# Patient Record
Sex: Female | Born: 1970 | Race: White | Hispanic: No | Marital: Married | Smoking: Former smoker
Health system: Southern US, Community
[De-identification: ages and names within clinical notes are randomized; demographics above are authoritative.]

## PROBLEM LIST (undated history)

## (undated) DIAGNOSIS — S42009A Fracture of unspecified part of unspecified clavicle, initial encounter for closed fracture: Secondary | ICD-10-CM

## (undated) DIAGNOSIS — S4290XA Fracture of unspecified shoulder girdle, part unspecified, initial encounter for closed fracture: Secondary | ICD-10-CM

## (undated) DIAGNOSIS — B379 Candidiasis, unspecified: Secondary | ICD-10-CM

## (undated) DIAGNOSIS — K219 Gastro-esophageal reflux disease without esophagitis: Secondary | ICD-10-CM

## (undated) DIAGNOSIS — S62109A Fracture of unspecified carpal bone, unspecified wrist, initial encounter for closed fracture: Secondary | ICD-10-CM

## (undated) DIAGNOSIS — E785 Hyperlipidemia, unspecified: Secondary | ICD-10-CM

## (undated) DIAGNOSIS — Z8619 Personal history of other infectious and parasitic diseases: Secondary | ICD-10-CM

## (undated) HISTORY — DX: Hyperlipidemia, unspecified: E78.5

## (undated) HISTORY — DX: Personal history of other infectious and parasitic diseases: Z86.19

## (undated) HISTORY — DX: Fracture of unspecified carpal bone, unspecified wrist, initial encounter for closed fracture: S62.109A

## (undated) HISTORY — DX: Fracture of unspecified shoulder girdle, part unspecified, initial encounter for closed fracture: S42.90XA

## (undated) HISTORY — PX: THERAPEUTIC ABORTION: SHX798

## (undated) HISTORY — DX: Candidiasis, unspecified: B37.9

## (undated) HISTORY — DX: Fracture of unspecified part of unspecified clavicle, initial encounter for closed fracture: S42.009A

## (undated) HISTORY — DX: Gastro-esophageal reflux disease without esophagitis: K21.9

---

## 1999-09-28 ENCOUNTER — Other Ambulatory Visit: Admission: RE | Admit: 1999-09-28 | Discharge: 1999-09-28 | Payer: Self-pay | Admitting: Obstetrics and Gynecology

## 1999-10-15 ENCOUNTER — Inpatient Hospital Stay (HOSPITAL_COMMUNITY): Admission: AD | Admit: 1999-10-15 | Discharge: 1999-10-15 | Payer: Self-pay | Admitting: Obstetrics and Gynecology

## 2000-03-29 ENCOUNTER — Inpatient Hospital Stay (HOSPITAL_COMMUNITY): Admission: AD | Admit: 2000-03-29 | Discharge: 2000-03-29 | Payer: Self-pay | Admitting: Obstetrics and Gynecology

## 2000-04-26 ENCOUNTER — Inpatient Hospital Stay (HOSPITAL_COMMUNITY): Admission: AD | Admit: 2000-04-26 | Discharge: 2000-04-26 | Payer: Self-pay | Admitting: Obstetrics and Gynecology

## 2000-04-27 ENCOUNTER — Inpatient Hospital Stay (HOSPITAL_COMMUNITY): Admission: AD | Admit: 2000-04-27 | Discharge: 2000-04-30 | Payer: Self-pay | Admitting: Obstetrics and Gynecology

## 2000-09-22 ENCOUNTER — Other Ambulatory Visit: Admission: RE | Admit: 2000-09-22 | Discharge: 2000-09-22 | Payer: Self-pay | Admitting: Obstetrics and Gynecology

## 2001-11-09 ENCOUNTER — Other Ambulatory Visit: Admission: RE | Admit: 2001-11-09 | Discharge: 2001-11-09 | Payer: Self-pay | Admitting: Obstetrics and Gynecology

## 2002-12-06 ENCOUNTER — Inpatient Hospital Stay (HOSPITAL_COMMUNITY): Admission: AD | Admit: 2002-12-06 | Discharge: 2002-12-06 | Payer: Self-pay | Admitting: *Deleted

## 2002-12-09 ENCOUNTER — Inpatient Hospital Stay (HOSPITAL_COMMUNITY): Admission: AD | Admit: 2002-12-09 | Discharge: 2002-12-11 | Payer: Self-pay | Admitting: Obstetrics and Gynecology

## 2003-01-25 ENCOUNTER — Other Ambulatory Visit: Admission: RE | Admit: 2003-01-25 | Discharge: 2003-01-25 | Payer: Self-pay | Admitting: *Deleted

## 2003-01-25 ENCOUNTER — Other Ambulatory Visit: Admission: RE | Admit: 2003-01-25 | Discharge: 2003-01-25 | Payer: Self-pay | Admitting: Obstetrics and Gynecology

## 2005-11-27 ENCOUNTER — Other Ambulatory Visit: Admission: RE | Admit: 2005-11-27 | Discharge: 2005-11-27 | Payer: Self-pay | Admitting: Obstetrics and Gynecology

## 2010-07-05 ENCOUNTER — Emergency Department (HOSPITAL_COMMUNITY): Admission: EM | Admit: 2010-07-05 | Discharge: 2010-07-05 | Payer: Self-pay | Admitting: Emergency Medicine

## 2010-12-28 NOTE — H&P (Signed)
   NAMEJANIYHA, MONTUFAR                       ACCOUNT NO.:  [id number]   MEDICAL RECORD NO.:  [id number]                   PATIENT TYPE:  INP   LOCATION:  9166                                 FACILITY:  [facility]   PHYSICIAN:  Crist Fat. Rivard, M.D.              DATE OF BIRTH:  10/18/1970   DATE OF ADMISSION:  12/09/2002  DATE OF DISCHARGE:                                HISTORY & PHYSICAL   HISTORY OF PRESENT ILLNESS:  This is a 40 year old gravida 3, para 1-0-1-1,  at 24 and 3/7 weeks who presents with complaints of increased uterine  contractions since this morning.  She is requesting an epidural.  Pregnancy  has been followed by the nurse midwife service, and is remarkable for a  negative Group B strep.  Otherwise, unremarkably.  OB history is remarkable  for an elective abortion, 1991, with no complications, and a vaginal  delivery in 2001 of a female infant at [redacted] weeks gestation, weighing 8 pounds  11 ounces, with no complications.   MEDICAL HISTORY:  Remarkable for childhood varicella and a history of  fractured collarbone, wrist, and shoulder.   SURGICAL HISTORY:  Remarkable for an elective abortion.   FAMILY HISTORY:  Remarkable for a mother with hypertension.  Genetic history  is unremarkable.   SOCIAL HISTORY:  The patient is married.  Her husband is involved and  supportive.  She is of the [place] faith.  She owns a bakery, and she  denies any alcohol, tobacco, or drug use.   OBJECTIVE DATA:  VITAL SIGNS:  Stable, afebrile.  HEENT:  Within normal limits.  Thyroid normal, not enlarged.  CHEST:  Clear to auscultation.  HEART:  Regular rate and rhythm.  ABDOMEN:  Gravid at 40 cm.  Caryn Bee.  EFM shows a fetal heart rate  which is reactive and has average variability.  Uterine contractions every 2  to 5 minutes.  CERVICAL EXAM:  5 cm, 90% effacement, minus 1 station.   ASSESSMENT:  1. Intrauterine pregnancy at 32 and 3/7 weeks.  2. Active labor.   PLAN:  1.  Admit to birthing suite.  Dr. Estanislado Pandy notified.  2. Routine CNM orders.  3. __________.     Marie L. Williams, C.N.M.                 Crist Fat Rivard, M.D.    MLW/MEDQ  D:  12/09/2002  T:  12/09/2002  Job:  [id number]

## 2010-12-28 NOTE — H&P (Signed)
[facility]  Patient:    Kari Harper, Kari Harper                    MRN: [id number] Adm. Date:  [id number] Attending:  Shaune Spittle Dictator:   Wynelle Bourgeois, P.A.                         History and Physical  HISTORY OF PRESENT ILLNESS:  Kari Harper is a 40 year old G2, para 1-0-1-0 at 39-6/7 weeks who presents with complaints of regular uterine contractions x 2-3 hours stronger within the last couple of hours.  She reports positive fetal movement and a gush of fluid at 2145 hours.  Pregnancy has been followed at [facility] by the midwife service and has been remarkable for: (1) Urinary retention in March.  (2) Husband with HSV with the patient never having had any lesions.  (3) Father of the baby aunt with Downs.  (4) Group B strep negative.  PRENATAL LABORATORY DATA:  Hemoglobin 12.2, platelets 266,000.  Blood type AB positive.  Antibody screen negative.  Toxoplasmosis negative.  RPR nonreactive.  Rubella immune.  HBsAg negative.  HIV nonreactive.  Pap test normal.  Gonorrhea negative.  Chlamydia negative.  AFP normal.  Glucose challenge normal.  Group B strep negative.  PAST OBSTETRICAL HISTORY:  Her OB history is remarkable for an elective abortion in 1991 at eight weeks gestation with no complications.  PAST MEDICAL HISTORY:  Positive for urinary retention in March 2001 which was treated by an indwelling catheter for several days which was then removed, and no further problems.  She has a history of fractured collar bone at age 45, fractured wrist at age 69, and a fractured shoulder at age 16.  FAMILY HISTORY:  Remarkable for her mother who has hypertension.  GENETIC HISTORY:  Remarkable for father of the babys mother who had twins and father of the babys aunt who has Downs syndrome.  SOCIAL HISTORY:  The patient is married to [employer] who is involved and supportive.  She works as a Engineer, building services and is of the [employer]. She  denies any alcohol, tobacco or drug use.  PHYSICAL EXAMINATION:  VITAL SIGNS:                    Stable.  The patient is afebrile.  HEENT:                          Within normal limits.  NECK:                           Thyroid normal, not enlarged.  BREASTS:                        Soft, nontender, no masses.  CARDIOVASCULAR:                 Regular rate and rhythm; no murmurs.  RESPIRATORY:                    Clear to auscultation bilaterally.  ABDOMEN:                        Gravid at 37 cm, vertex to Leopolds maneuver. Fetal monitor shows a reactive fetal heart rate tracing with no decelerations and positive accelerations with  average variability.  PELVIC:                         Uterine contractions every two to four minutes, moderately strong.  Positive growth.  Rupture of membranes of clear fluid.  Cervical exam 1-2 cm, 75% effaced and -1 station with a vertex presentation.  EXTREMITIES:                    Within normal limits.  ASSESSMENT:                     1. Premature rupture of membranes at term.                                 2. Early labor.  PLAN:                           1. Admit to birthing suite; Dr. Pennie Rushing aware.                                 2. Routine CNM orders.                                 3. Intermittent monitoring. DD:  04/27/00 TD:  04/27/00 Job: [id number]

## 2012-01-14 ENCOUNTER — Ambulatory Visit (INDEPENDENT_AMBULATORY_CARE_PROVIDER_SITE_OTHER): Payer: BC Managed Care – PPO | Admitting: Obstetrics and Gynecology

## 2012-01-14 ENCOUNTER — Encounter: Payer: Self-pay | Admitting: Obstetrics and Gynecology

## 2012-01-14 VITALS — BP 90/58 | Ht 62.5 in | Wt 120.5 lb

## 2012-01-14 DIAGNOSIS — F419 Anxiety disorder, unspecified: Secondary | ICD-10-CM

## 2012-01-14 DIAGNOSIS — F4329 Adjustment disorder with other symptoms: Secondary | ICD-10-CM

## 2012-01-14 DIAGNOSIS — F411 Generalized anxiety disorder: Secondary | ICD-10-CM

## 2012-01-14 DIAGNOSIS — Z124 Encounter for screening for malignant neoplasm of cervix: Secondary | ICD-10-CM | POA: Insufficient documentation

## 2012-01-14 DIAGNOSIS — F329 Major depressive disorder, single episode, unspecified: Secondary | ICD-10-CM

## 2012-01-14 MED ORDER — ALPRAZOLAM 0.25 MG PO TABS: ORAL_TABLET | ORAL | Status: AC

## 2012-01-14 NOTE — Progress Notes (Signed)
Pt c/o irregular menses occuring at different times of the month for the past 2 months.Length of cycles lasting the same;usally 3-4 days and normal flow.Almost monthly at this point.    Pt is flying out of town on Friday and c/o inc anxiety while flying. Wants to know if any meds can be prescribed. Pt lost brother and father last year.  Last Pap: 12/19/2010 WNL: Yes Regular Periods:no Contraception: vas  Monthly Breast exam:yes Tetanus<42yrs:no Nl.Bladder Function:yes Daily BMs:yes Healthy Diet:yes Calcium:no Mammogram:yes 04/04/11@[facility]  Exercise:yes Seatbelt: yes Abuse at home: no Stressful work:no Sigmoid-colonoscopy: n/a Bone Density: No BMI=21 Subjective:    Kari Harper is a 41 y.o. female, [id number], who presents for an annual exam.     History   Social History  . Marital Status: Married    Spouse Name: N/A    Number of Children: N/A  . Years of Education: N/A   Social History Main Topics  . Smoking status: Never Smoker   . Smokeless tobacco: Never Used  . Alcohol Use: No  . Drug Use: No  . Sexually Active: Yes     husband with vasectomy   Other Topics Concern  . None   Social History Narrative  . None    Menstrual cycle:   LMP: Patient's last menstrual period was 12/30/2011.           Cycle: slightly less regular than in prior years  The following portions of the patient's history were reviewed and updated as appropriate: allergies, current medications, past family history, past medical history, past social history, past surgical history and problem list. 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 Breast:Negative for breast lump,nipple discharge or nipple retraction Gastrointestinal: Negative for abdominal pain, change in bowel habits or rectal bleeding Urinary:negative   Objective:    BP 90/58  Ht 5' 2.5" (1.588 m)  Wt 120 lb 8 oz (54.658 kg)   BMI 21.69 kg/m2  LMP 12/30/2011    Weight:  Wt Readings from Last 1 Encounters:  01/14/12 120 lb 8 oz (54.658 kg)          BMI: Body mass index is 21.69 kg/(m^2).  General Appearance: Alert, appropriate appearance for age. No acute distress HEENT: Grossly normal Neck / Thyroid: Supple, no masses, nodes or enlargement Lungs: clear to auscultation bilaterally Back: No CVA tenderness Breast Exam: No masses or nodes.No dimpling, nipple retraction or discharge. Cardiovascular: Regular rate and rhythm. S1, S2, no murmur Gastrointestinal: Soft, non-tender, no masses or organomegaly Pelvic Exam: Vulva and vagina appear normal. Bimanual exam reveals normal uterus and adnexa. Rectovaginal: normal rectal, no masses Lymphatic Exam: Non-palpable nodes in neck, clavicular, axillary, or inguinal regions Skin: no rash or abnormalities Neurologic: Normal gait and speech, no tremor  Psychiatric: Alert and oriented, appropriate affect.   Wet Prep:not applicable Urinalysis:not applicable UPT: Not done   Assessment:    Normal gyn exam    Plan:    mammogram pap smear. A detailed discussion was held with the patient concerning her lack of an abnormal Pap smear history and recommended guidelines for cervical cancer screening for her.  She wishes to have a Pap smear today, think about whether she wants to have HPV testing and call us if indeed she does. Otherwise she will do research and plan to have Pap and HPV next year with the anticipation that her cervical cancer screening will be every  3-5 years subsequent to that. return annually or prn STD screening: declined Contraception:vasectomy Xanax 0.25 mg p.o. T.i.d. P.r.n. Anxiety #10 given      [name] PMD

## 2012-01-16 LAB — PAP IG W/ RFLX HPV ASCU

## 2014-06-13 ENCOUNTER — Encounter: Payer: Self-pay | Admitting: Obstetrics and Gynecology

## 2015-11-15 ENCOUNTER — Other Ambulatory Visit: Payer: Self-pay | Admitting: Internal Medicine

## 2015-11-15 DIAGNOSIS — R1013 Epigastric pain: Secondary | ICD-10-CM

## 2015-11-20 ENCOUNTER — Other Ambulatory Visit: Payer: Self-pay | Admitting: Gastroenterology

## 2015-11-20 ENCOUNTER — Other Ambulatory Visit: Payer: Self-pay

## 2015-11-20 DIAGNOSIS — R1084 Generalized abdominal pain: Secondary | ICD-10-CM

## 2015-11-27 ENCOUNTER — Ambulatory Visit
Admission: RE | Admit: 2015-11-27 | Discharge: 2015-11-27 | Disposition: A | Discharge status: Home / Self Care | Payer: BLUE CROSS/BLUE SHIELD | Source: Ambulatory Visit | Attending: Gastroenterology | Admitting: Gastroenterology

## 2015-11-27 DIAGNOSIS — R1084 Generalized abdominal pain: Secondary | ICD-10-CM

## 2016-11-18 ENCOUNTER — Ambulatory Visit
Admission: RE | Admit: 2016-11-18 | Discharge: 2016-11-18 | Disposition: A | Discharge status: Home / Self Care | Payer: BLUE CROSS/BLUE SHIELD | Source: Ambulatory Visit | Attending: Internal Medicine | Admitting: Internal Medicine

## 2016-11-18 ENCOUNTER — Other Ambulatory Visit: Payer: Self-pay | Admitting: Internal Medicine

## 2016-11-18 DIAGNOSIS — Z0000 Encounter for general adult medical examination without abnormal findings: Secondary | ICD-10-CM

## 2016-11-20 ENCOUNTER — Encounter (HOSPITAL_COMMUNITY): Payer: Self-pay

## 2016-11-20 ENCOUNTER — Emergency Department (HOSPITAL_COMMUNITY)
Admission: EM | Admit: 2016-11-20 | Discharge: 2016-11-20 | Disposition: A | Discharge status: Home / Self Care | Payer: BLUE CROSS/BLUE SHIELD | Attending: Emergency Medicine | Admitting: Emergency Medicine

## 2016-11-20 ENCOUNTER — Emergency Department (HOSPITAL_COMMUNITY): Payer: BLUE CROSS/BLUE SHIELD

## 2016-11-20 DIAGNOSIS — R0789 Other chest pain: Secondary | ICD-10-CM | POA: Diagnosis present

## 2016-11-20 DIAGNOSIS — R072 Precordial pain: Secondary | ICD-10-CM | POA: Diagnosis not present

## 2016-11-20 DIAGNOSIS — I2510 Atherosclerotic heart disease of native coronary artery without angina pectoris: Secondary | ICD-10-CM | POA: Diagnosis not present

## 2016-11-20 LAB — TROPONIN I: Troponin I: <0.03 ng/mL (ref <0.03)

## 2016-11-20 LAB — CBC
HCT: 38.1 % (ref 36.0–46.0)
Hemoglobin: 13.3 g/dL (ref 12.0–15.0)
MCH: 29.4 pg (ref 26.0–34.0)
MCHC: 34.9 g/dL (ref 30.0–36.0)
MCV: 84.1 fL (ref 78.0–100.0)
PLATELETS: 226 10*3/uL (ref 150–400)
RBC: 4.53 MIL/uL (ref 3.87–5.11)
RDW: 12.1 % (ref 11.5–15.5)
WBC: 7.2 10*3/uL (ref 4.0–10.5)

## 2016-11-20 LAB — BASIC METABOLIC PANEL
Anion gap: 10 (ref 5–15)
BUN: 11 mg/dL (ref 6–20)
CALCIUM: 9.5 mg/dL (ref 8.9–10.3)
CO2: 25 mmol/L (ref 22–32)
Chloride: 103 mmol/L (ref 101–111)
Creatinine, Ser: 0.62 mg/dL (ref 0.44–1.00)
GFR calc Af Amer: >60 mL/min (ref >60)
GLUCOSE: 98 mg/dL (ref 65–99)
Potassium: 3.8 mmol/L (ref 3.5–5.1)
Sodium: 138 mmol/L (ref 135–145)

## 2016-11-20 LAB — I-STAT TROPONIN, ED: TROPONIN I, POC: 0 ng/mL (ref 0.00–0.08)

## 2016-11-20 MED ORDER — OMEPRAZOLE 20 MG PO CPDR: 20.0000 mg | DELAYED_RELEASE_CAPSULE | Freq: Every day | ORAL | 0 refills | Status: DC

## 2016-11-20 MED ORDER — GI COCKTAIL ~~LOC~~
30.0000 mL | Freq: Once | ORAL | Status: AC
  Administered 2016-11-20: 30 mL via ORAL
  Filled 2016-11-20: qty 30

## 2016-11-20 MED ORDER — SUCRALFATE 1 GM/10ML PO SUSP: 1.0000 g | Freq: Three times a day (TID) | ORAL | 0 refills | Status: AC

## 2016-11-20 NOTE — ED Triage Notes (Addendum)
Pt presents to the ed with complaints of chest pain on and off for a year.  Today she started having really bad pain in the front of her chest that goes into her back also with off and on numbness in the back of her neck.  She has pain in her left side of her neck that is worse with movement. Pt has related her chest pain to stress before, today she does not feel it is related to stress. She was just recently diagnosed with acid reflux.  Her brother died with a heart attack at 71. Reports worse pain with a deep breath in her left shoulder

## 2016-11-20 NOTE — ED Notes (Signed)
Patient complaining of intermittent chest pain that radiates to her back and "stays" there.

## 2016-11-20 NOTE — ED Notes (Signed)
Follow up made with main lab about troponin I results, per lab staff results will be available in few minutes

## 2016-11-20 NOTE — Discharge Instructions (Signed)

## 2016-11-20 NOTE — ED Provider Notes (Signed)
Emergency Department Provider Note   I have reviewed the triage vital signs and the nursing notes.   HISTORY  Chief Complaint Chest Pain   HPI Kari Harper is a 46 y.o. female with PMH of anxiety and family history of CAD presents to the emergency room in for evaluation of intermittent, worsening central chest pain/pressure. The patient describes occasional episodes of similar pain in the past that resolved. She spoke with her primary care physician who obtained an outpatient EKG which was normal. Today she had multiple episodes of central chest pressure/burning pain that radiates through to her back. No exacerbating or alleviating factors. No other areas of radiation. No fevers or chills. No productive cough. Patient is a family history of heart attack with her brother having one at the age of 54. She states this was thought to be secondary to underlying cancer treatment. Denies pain currently.   Past Medical History:  Diagnosis Date  . Collar bone fracture    Age 16  . Fractured shoulder    Age 89  . H/O varicella   . Wrist fracture    Age 48   . Yeast infection     Patient Active Problem List   Diagnosis Date Noted  . Screening for malignant neoplasm of the cervix 01/14/2012  . Anxiety 01/14/2012    Past Surgical History:  Procedure Laterality Date  . THERAPEUTIC ABORTION     74yrs of age    Current Outpatient Rx  . Order #: [id number] Class: Print  . Order #: [id number] Class: Print  . Order #: [id number] Class: Print    Allergies Patient has no known allergies.  Family History  Problem Relation Age of Onset  . Hypertension Mother   . Cancer Father     brain  . Cancer Brother     testicular    Social History Social History  Substance Use Topics  . Smoking status: Never Smoker  . Smokeless tobacco: Never Used  . Alcohol use No    Review of Systems  Constitutional: No fever/chills Eyes: No visual changes. ENT: No sore throat. Cardiovascular:  Positive intermittent chest pain. Respiratory: Denies shortness of breath. Gastrointestinal: No abdominal pain.  No nausea, no vomiting.  No diarrhea.  No constipation. Genitourinary: Negative for dysuria. Musculoskeletal: Negative for back pain. Skin: Negative for rash. Neurological: Negative for headaches, focal weakness or numbness.  10-point ROS otherwise negative.  ____________________________________________   PHYSICAL EXAM:  VITAL SIGNS: ED Triage Vitals  Enc Vitals Group     BP 11/20/16 1733 135/89     Pulse Rate 11/20/16 1733 86     Resp 11/20/16 1733 18     Temp 11/20/16 1733 98 F (36.7 C)     Temp Source 11/20/16 1733 Oral     SpO2 11/20/16 1733 100 %     Weight 11/20/16 1852 126 lb (57.2 kg)     Height 11/20/16 1852  (1.575 m)     Pain Score 11/20/16 1656 5   Constitutional: Alert and oriented. Well appearing and in no acute distress. Eyes: Conjunctivae are normal.  Head: Atraumatic. Nose: No congestion/rhinnorhea. Mouth/Throat: Mucous membranes are moist.   Neck: No stridor.   Cardiovascular: Normal rate, regular rhythm. Good peripheral circulation. Grossly normal heart sounds.   Respiratory: Normal respiratory effort.  No retractions. Lungs CTAB. Gastrointestinal: Soft and nontender. No distention.  Musculoskeletal: No lower extremity tenderness nor edema. No gross deformities of extremities. Neurologic:  Normal speech and language. No gross focal neurologic deficits  are appreciated.  Skin:  Skin is warm, dry and intact. No rash noted.  ____________________________________________   LABS (all labs ordered are listed, but only abnormal results are displayed)  Labs Reviewed  BASIC METABOLIC PANEL  CBC  TROPONIN I  I-STAT TROPOININ, ED   ____________________________________________  EKG   EKG Interpretation  Date/Time:  Wednesday November 20 2016 16:57:24 EDT Ventricular Rate:  110 PR Interval:  152 QRS Duration: 80 QT Interval:  332 QTC  Calculation: 449 R Axis:   98 Text Interpretation:  Sinus tachycardia Rightward axis T wave abnormality, consider inferior ischemia Abnormal ECG No STEMI.  Confirmed by Zolton Dowson MD, Lonney Revak [phone]) on 11/20/2016 8:13:39 PM       ____________________________________________  RADIOLOGY  Dg Chest 2 View  Result Date: 11/20/2016 CLINICAL DATA:  Acute onset chest pain radiating to neck. EXAM: CHEST  2 VIEW COMPARISON:  11/18/2016 FINDINGS: The heart size and mediastinal contours are within normal limits. Both lungs are clear. The visualized skeletal structures are unremarkable. IMPRESSION: Negative.  No active cardiopulmonary disease. Electronically Signed   By: Myles Rosenthal M.D.   On: 11/20/2016 17:48    ____________________________________________   PROCEDURES  Procedure(s) performed:   Procedures  None ____________________________________________   INITIAL IMPRESSION / ASSESSMENT AND PLAN / ED COURSE  Pertinent labs & imaging results that were available during my care of the patient were reviewed by me and considered in my medical decision making (see chart for details).  Patient resents to the emergency department for evaluation of chest pain. She had intermittent symptoms that have worsened significantly today. She is currently chest pain-free. HEART score 2. PERRL negative with low pretest probability for PE by [place].   Second troponin negative. Patient feeling much better after GI cocktail. Plan for GI and Cardiology follow up as an outpatient.   At this time, I do not feel there is any life-threatening condition present. I have reviewed and discussed all results (EKG, imaging, lab, urine as appropriate), exam findings with patient. I have reviewed nursing notes and appropriate previous records.  I feel the patient is safe to be discharged home without further emergent workup. Discussed usual and customary return precautions. Patient and family (if present) verbalize understanding  and are comfortable with this plan.  Patient will follow-up with their primary care provider. If they do not have a primary care provider, information for follow-up has been provided to them. All questions have been answered.   ____________________________________________  FINAL CLINICAL IMPRESSION(S) / ED DIAGNOSES  Final diagnoses:  Precordial chest pain     MEDICATIONS GIVEN DURING THIS VISIT:  Medications  gi cocktail (Maalox,Lidocaine,Donnatal) (30 mLs Oral Given 11/20/16 1919)     NEW OUTPATIENT MEDICATIONS STARTED DURING THIS VISIT:  Discharge Medication List as of 11/20/2016  9:49 PM    START taking these medications   Details  omeprazole (PRILOSEC) 20 MG capsule Take 1 capsule (20 mg total) by mouth daily., Starting Wed 11/20/2016, Print    sucralfate (CARAFATE) 1 GM/10ML suspension Take 10 mLs (1 g total) by mouth 4 (four) times daily -  with meals and at bedtime., Starting Wed 11/20/2016, Print         Note:  This document was prepared using Dragon voice recognition software and may include unintentional dictation errors.  Alona Bene, MD Emergency Medicine   Maia Plan, MD 11/20/16 [phone]

## 2016-11-29 NOTE — Progress Notes (Signed)
Cardiology Office Note  NEW PATIENT VISIT    Date:  12/02/2016   ID:  Kari Harper, DOB 09-28-1970, MRN [id number]  PCP:  Pcp Not In System  Cardiologist:  NEW  Dr. Herbie Baltimore   Chief Complaint  Patient presents with  . Hospital f/u visit    pt states she has constant CP on right side---going for Endoscopy for possible ulcer; here for clearance; no other Sx.  . Medication Problem    pt states she was started on Atorvastatin by PCP--she took it for a couple of days and stopped do to feeling "weird" and having tingling on left jawline      History of Present Illness: Kari Harper is a 45 y.o. female who presents for chest pain and family hx of premature CAD in brother who died at 32-but he had undergone cancer treatment and may have had a clot as well.  She was seen in ER on 11/20/16 with chest pain though she has had that for about a year off and on.  In EF also with neck pain.   Troponin was neg <0.03 , CBC was normal and lytes were normal.  EKG SR  Rt ward axis. She was discharged from [facility] with Prilosec and carfate.    Today she tells me she has had this chronic chest pain mostly on the rt and sometimes mid chest with radiation to back.  She started with this last year and had a lot of stress with her father's death and her mother's cancer.  She began taking care of herself with meditation and and rest and symptoms improved.  Since her visit to ER she has improved with prilosec and carafate and has seen Dr. Evette Cristal with GI and plans are for Endo in 2 weeks.  She stil has this ache on rt side at times.  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  With the pain, no SOB no nausea unless she becomes anxious with it and no diaphoresis.   She is more aware of the pain at rest, if she is busy she does not notice it.  EKG form ER with T wave inversion in inf leads and this is new from EKG in 2015.    Her PCP added statin but she started  feeling lightheaded with taking so she stopped.     Past Medical History:  Diagnosis Date  . Collar bone fracture    Age 25  . Fractured shoulder    Age 87  . H/O varicella   . Wrist fracture    Age 65   . Yeast infection     Past Surgical History:  Procedure Laterality Date  . THERAPEUTIC ABORTION     72yrs of age     Current Outpatient Prescriptions  Medication Sig Dispense Refill  . ALPRAZolam (XANAX) 0.25 MG tablet 1 tablet by mouth up to three times daily. 10 tablet 0  . omeprazole (PRILOSEC) 20 MG capsule Take 1 capsule (20 mg total) by mouth daily. 30 capsule 0  . sucralfate (CARAFATE) 1 GM/10ML suspension Take 10 mLs (1 g total) by mouth 4 (four) times daily -  with meals and at bedtime. (Patient taking differently: Take 1 g by mouth as needed. ) 420 mL 0   No current facility-administered medications for this visit.     Allergies:   Patient has no known allergies.    Social History:  The patient  reports that she has never smoked. She has never used smokeless tobacco. She reports that she does not drink alcohol or use drugs.   Family History:  The patient's family history includes Cancer in her brother and father; Hypertension in her mother.  Her brother died with clot to his heart per pt.  ROS:  General:no colds or fevers, no weight changes Skin:no rashes or ulcers HEENT:no blurred vision, no congestion CV:see HPI PUL:see HPI GI:no diarrhea constipation or melena, no indigestion GU:no hematuria, no dysuria MS:no joint pain, no claudication Neuro:no syncope, no lightheadedness Endo:no diabetes, no thyroid disease  Wt Readings from Last 3 Encounters:  12/02/16 124 lb 12.8 oz (56.6 kg)  11/20/16 126 lb (57.2 kg)  01/14/12 120 lb 8 oz (54.7 kg)     PHYSICAL EXAM: VS:  BP 114/70 (BP Location: Right Arm, Patient Position: Sitting, Cuff Size: Normal)   Pulse 92   Ht  (1.575 m)   Wt 124 lb 12.8 oz (56.6 kg)   LMP 11/10/2016 (Approximate)   BMI  22.83 kg/m  , BMI Body mass index is 22.83 kg/m. General:Pleasant affect, NAD Skin:Warm and dry, brisk capillary refill HEENT:normocephalic, sclera clear, mucus membranes moist Neck:supple, no JVD, no bruits  Heart:S1S2 RRR without murmur, gallup, rub or click Lungs:clear without rales, rhonchi, or wheezes ZOX:WRUE, non tender, + BS, do not palpate liver spleen or masses Ext:no lower ext edema, 2+ pedal pulses, 2+ radial pulses Neuro:alert and oriented, MAE, follows commands, + facial symmetry    EKG:  EKG is NOT ordered today. The ekg from ER reviewed and compared to 2015.   Recent Labs: 11/20/2016: BUN 11; Creatinine, Ser 0.62; Hemoglobin 13.3; Platelets 226; Potassium 3.8; Sodium 138    Lipid Panel No results found for: CHOL, TRIG, HDL, CHOLHDL, VLDL, LDLCALC, LDLDIRECT     Other studies Reviewed: Additional studies/ records that were reviewed today include:EKGs.   ASSESSMENT AND PLAN:  1.  Chest pain this is atypical but with abnormal EKG and ongoing pain after discussing with Dr. Herbie Baltimore we have ordered a cardiac CTA with ca+ score and FFR to give [place] structure information and function.  If + we will discuss cath and if neg reassure. But with her Brother's hx and her dislike of meds this will give [place] a better idea of treatment.  We will call her the results and she will follow  Up with Dr. Herbie Baltimore .  If neg would recommend the endo.    Current medicines are reviewed with the patient today.  The patient Has no concerns regarding medicines.  The following changes have been made:  See above Labs/ tests ordered today include:see above  Disposition:   FU:  see above  Signed, Nada Boozer, NP  12/02/2016 10:42 AM    [facility] [address] [place], [place], [place]  [address]/ [address] [place], [place] Phone: [phone]; Fax: [phone]  [phone]

## 2016-12-02 ENCOUNTER — Ambulatory Visit (INDEPENDENT_AMBULATORY_CARE_PROVIDER_SITE_OTHER): Payer: BLUE CROSS/BLUE SHIELD | Admitting: Cardiology

## 2016-12-02 ENCOUNTER — Encounter: Payer: Self-pay | Admitting: Cardiology

## 2016-12-02 VITALS — BP 114/70 | HR 92 | Ht 62.0 in | Wt 124.8 lb

## 2016-12-02 DIAGNOSIS — R9431 Abnormal electrocardiogram [ECG] [EKG]: Secondary | ICD-10-CM | POA: Diagnosis not present

## 2016-12-02 DIAGNOSIS — R079 Chest pain, unspecified: Secondary | ICD-10-CM

## 2016-12-02 NOTE — Patient Instructions (Signed)
Medication Instructions:   Your physician recommends that you continue on your current medications as directed. Please refer to the Current Medication list given to you today.   If you need a refill on your cardiac medications before your next appointment, please call your pharmacy.  Labwork: NONE ORDERED  TODAY    Testing/Procedures:  Cardiac CT scanning, (CAT scanning), is a noninvasive, special x-ray that produces cross-sectional images of the body using x-rays and a computer. CT scans help physicians diagnose and treat medical conditions. For some CT exams, a contrast material is used to enhance visibility in the area of the body being studied. CT scans provide greater clarity and reveal more details than regular x-ray exams.    Follow-Up: WITH DR Herbie Baltimore WITH RESULTS.Marland Kitchen   Any Other Special Instructions Will Be Listed Below (If Applicable).

## 2016-12-06 ENCOUNTER — Ambulatory Visit (HOSPITAL_COMMUNITY): Admission: RE | Admit: 2016-12-06 | Payer: BLUE CROSS/BLUE SHIELD | Source: Ambulatory Visit

## 2016-12-06 ENCOUNTER — Ambulatory Visit (HOSPITAL_COMMUNITY)
Admission: RE | Admit: 2016-12-06 | Discharge: 2016-12-06 | Disposition: A | Discharge status: Home / Self Care | Payer: BLUE CROSS/BLUE SHIELD | Source: Ambulatory Visit | Attending: Cardiology | Admitting: Cardiology

## 2016-12-06 DIAGNOSIS — R911 Solitary pulmonary nodule: Secondary | ICD-10-CM | POA: Insufficient documentation

## 2016-12-06 DIAGNOSIS — R079 Chest pain, unspecified: Secondary | ICD-10-CM | POA: Diagnosis not present

## 2016-12-06 DIAGNOSIS — I2510 Atherosclerotic heart disease of native coronary artery without angina pectoris: Secondary | ICD-10-CM | POA: Diagnosis not present

## 2016-12-06 MED ORDER — NITROGLYCERIN 0.4 MG SL SUBL
SUBLINGUAL_TABLET | SUBLINGUAL | Status: AC
  Filled 2016-12-06: qty 2

## 2016-12-06 MED ORDER — METOPROLOL TARTRATE 5 MG/5ML IV SOLN
5.0000 mg | INTRAVENOUS | Status: AC
  Administered 2016-12-06 (×3): 5 mg via INTRAVENOUS

## 2016-12-06 MED ORDER — IOPAMIDOL (ISOVUE-370) INJECTION 76%
INTRAVENOUS | Status: AC
  Administered 2016-12-06: 100 mL
  Filled 2016-12-06: qty 100

## 2016-12-06 MED ORDER — METOPROLOL TARTRATE 5 MG/5ML IV SOLN
INTRAVENOUS | Status: AC
  Filled 2016-12-06: qty 5

## 2016-12-06 MED ORDER — METOPROLOL TARTRATE 5 MG/5ML IV SOLN
INTRAVENOUS | Status: AC
  Filled 2016-12-06: qty 10

## 2016-12-06 MED ORDER — NITROGLYCERIN 0.4 MG SL SUBL
0.8000 mg | SUBLINGUAL_TABLET | SUBLINGUAL | Status: DC | PRN
  Administered 2016-12-06: 0.8 mg via SUBLINGUAL

## 2016-12-12 ENCOUNTER — Ambulatory Visit (INDEPENDENT_AMBULATORY_CARE_PROVIDER_SITE_OTHER): Payer: BLUE CROSS/BLUE SHIELD | Admitting: Cardiology

## 2016-12-12 ENCOUNTER — Encounter: Payer: Self-pay | Admitting: Cardiology

## 2016-12-12 VITALS — BP 118/80 | HR 82 | Ht 62.0 in | Wt 125.8 lb

## 2016-12-12 DIAGNOSIS — R079 Chest pain, unspecified: Secondary | ICD-10-CM | POA: Diagnosis not present

## 2016-12-12 DIAGNOSIS — E785 Hyperlipidemia, unspecified: Secondary | ICD-10-CM

## 2016-12-12 DIAGNOSIS — R911 Solitary pulmonary nodule: Secondary | ICD-10-CM | POA: Diagnosis not present

## 2016-12-12 DIAGNOSIS — I2510 Atherosclerotic heart disease of native coronary artery without angina pectoris: Secondary | ICD-10-CM

## 2016-12-12 DIAGNOSIS — I2584 Coronary atherosclerosis due to calcified coronary lesion: Secondary | ICD-10-CM | POA: Diagnosis not present

## 2016-12-12 MED ORDER — ROSUVASTATIN CALCIUM 10 MG PO TABS: 10.0000 mg | ORAL_TABLET | Freq: Every day | ORAL | 3 refills | Status: AC

## 2016-12-12 NOTE — Patient Instructions (Signed)
Start crestor 10 mg  At bedtime daily  primary to refill    Your physician recommends that you schedule a follow-up appointment on an as needed basis

## 2016-12-12 NOTE — Progress Notes (Signed)
PCP: Lorenda IshiharaVaradarajan, Rupashree, MD  Clinic Note: Chief Complaint  Patient presents with  . Follow-up    Pt. states no complaints    HPI: Kari Harper is a 46 y.o. female with a PMH below who presents today for Two-week follow-up of chest pain with a family history of premature CAD. Seen in the ER on 11/20/2016 - Brother died at age 47 with CAD issues but also was having cancer treatment and blood clots.  Kari Harper was last seen on 12/02/2016 by Nada BoozerLaura Ingold, NP - described right-sided chest pain  Recent Hospitalizations: ER Visit  Studies Personally Reviewed - if available, images/films reviewed: From Epic Chart or Care EveryWhere   Coronary Calcium Score/ coronary CTA IMPRESSION: 1. Coronary calcium score of 33. This was 1698 percentile for age and sex matched control. 2. Normal coronary origin with right dominance. 3. Mild non-obstructive CAD (less than 50% stenosis in LAD) in proximal RCA. An aggressive risk Factor modifications is recommended. -- Noncardiac: 4 mm left lower lobe nodule. Consider 12 month follow-up noncontrasted CT  Interval History: Kari Harper presents today for follow-up from her recent study. She says that she has been under quite a bit of stress with being the caregiver for multiple family members both her brother and father died and now she's caring for her mother. She is feeling right now she needs take care of herself. She is worried that she may be having an ulcer and is due to have an EGD by her PCP. She describes to me a pain up along the right pectoral muscle going to her shoulder. She also has some tightness on the right trapezius/supraspinatus muscle. She tells me that she has been doing some throwing and exercises and thinks that that may be where it came from. This is really the discomfort she is noted. She also has an area of point tenderness along the left sternal border. This is made worse with deep inspiration and coughing. Otherwise she  is not really having any significant exertional chest tightness or pressure.   Cardiac Review of Symptoms:  No shortness of breath with rest or exertion. No PND, orthopnea or edema. No palpitations, lightheadedness, dizziness, weakness or syncope/near syncope.  No TIA/amaurosis fugax symptoms. No claudication.  ROS: A comprehensive was performed. Review of Systems  Gastrointestinal: Negative for blood in stool and melena.  Genitourinary: Negative for hematuria.  Musculoskeletal: Positive for joint pain (R shoulder - see HPI).  Neurological: Negative for dizziness.  Endo/Heme/Allergies: Positive for environmental allergies.  Psychiatric/Behavioral: The patient is nervous/anxious (lots of stress).        Emotionally fatigued  All other systems reviewed and are negative.  I have reviewed and (if needed) personally updated the patient's problem list, medications, allergies, past medical and surgical history, social and family history.   Past Medical History:  Diagnosis Date  . Collar bone fracture    Age 46  . Fractured shoulder    Age 89  . H/O varicella   . Wrist fracture    Age 46   . Yeast infection     Past Surgical History:  Procedure Laterality Date  . THERAPEUTIC ABORTION     89yrs of age    Current Meds  Medication Sig  . ALPRAZolam (XANAX) 0.25 MG tablet 1 tablet by mouth up to three times daily. (Patient taking differently: at bedtime as needed for anxiety (ONLY WHEN GETTING ON FLIGHTS). 1 tablet by mouth up to three times daily.)  . omeprazole (PRILOSEC)  20 MG capsule Take 1 capsule (20 mg total) by mouth daily.  . sucralfate (CARAFATE) 1 GM/10ML suspension Take 10 mLs (1 g total) by mouth 4 (four) times daily -  with meals and at bedtime. (Patient taking differently: Take 1 g by mouth as needed. )    No Known Allergies  Social History   Social History  . Marital status: Married    Spouse name: N/A  . Number of children: N/A  . Years of education: N/A    Social History Main Topics  . Smoking status: Never Smoker  . Smokeless tobacco: Never Used  . Alcohol use No  . Drug use: No  . Sexual activity: Yes     Comment: husband with vasectomy   Other Topics Concern  . None   Social History Narrative  . None    family history includes Cancer in her brother and father; Hypertension in her mother.  Wt Readings from Last 3 Encounters:  12/12/16 125 lb 12.8 oz (57.1 kg)  12/02/16 124 lb 12.8 oz (56.6 kg)  11/20/16 126 lb (57.2 kg)    PHYSICAL EXAM BP 118/80   Pulse 82   Ht 5\' 2"  (1.575 m)   Wt 125 lb 12.8 oz (57.1 kg)   BMI 23.01 kg/m  General appearance: alert, cooperative, appears stated age, no distress and Healthy-appearing. Well-nourished well-groomed. Neck: no adenopathy, no carotid bruit and no JVD Lungs: clear to auscultation bilaterally, normal percussion bilaterally and non-labored Heart: regular rate and rhythm, S1& S2 normal, no murmur, click, rub or gallop; none is listed MI Abdomen: soft, non-tender; bowel sounds normal; no masses,  no organomegaly;  Extremities: extremities normal, atraumatic, no cyanosis, and edema - none Pulses: 2+ and symmetric;  Skin: mobility and turgor normal, no edema, no evidence of bleeding or bruising, no lesions noted, temperature normal and texture normal or  Neurologic: Mental status: Alert, oriented, thought content appropriate; pleasant mood and affect. She seems a little bit stressed.   Adult ECG Report n/a  Other studies Reviewed: Additional studies/ records that were reviewed today include:  Recent Labs: 11/18/2016  from PCP  BUN 11, Cr 0.62, Glu 98, Ca2+ 9.5; AST 15, ALT, 16,    H/H 13.3/38.1  TC 235, HDL 45, LDL 168 ; LPa 4   ASSESSMENT / PLAN:  Overall I think [name] has very low risk chest pain. She does assessment evidence of cord calcification in warrants closer monitoring. I started her on Crestor as noted below. I think though she can follow-up with her PCP  and see [place] on an as-needed basis. I have written a prescription with some refills on the Crestor, but would defer to PCP for follow-up after this.  Problem List Items Addressed This Visit    Chest pain with low risk for cardiac etiology    Her chest pain that she is describing to me is mostly related to the right rotator cuff with the pectoralis minor and supraspinatus muscles soreness. The left-sided chest wall pain is probably consistent with mild costochondritis.      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 I reviewed the entire study with her in detail and explained that the space sleeping means that she does have some baseline coronary disease and that we want to ramp up the level of monitoring of risk factors.  I think we should probably start her on a  statin based on her most recent lipid levels. Will start on 10 mg Crestor.  Don't think her chest pain was related to this lesion, but I think we need to treat her risk factors more aggressively.      Relevant Medications   rosuvastatin (CRESTOR) 10 MG tablet   Dyslipidemia    We did get her labs after I saw her.  She knew that her total cholesterol was, high meld LDL is high. In light of her having coronary disease on CT scan that is mild, yet present given her young age. She has tried diet and exercise and still has had difficult control lipids. For now we will try low-dose Crestor. We also talked about coenzyme Q 10.      Relevant Medications   rosuvastatin (CRESTOR) 10 MG tablet   Incidental lung nodule, less than or equal to 3mm    She is very worried about this incidental finding on her left long. I promised that I would mention this in my note with interest in having her PCP follow-up on this with a repeat study in maybe 6 months just for her reassurance. She has had too much illness and sickness and family members to risk having  something happened to her.         Current medicines are reviewed at length with the patient today. (+/- concerns) n/a The following changes have been made: n/a  Patient Instructions  Start crestor 10 mg  At bedtime daily  primary to refill    Your physician recommends that you schedule a follow-up appointment on an as needed basis    Studies Ordered:   No orders of the defined types were placed in this encounter.     Bryan Lemma, M.D., M.S. Interventional Cardiologist   Pager # [phone] Phone # [phone] [address] [place], [place] [address]

## 2016-12-14 DIAGNOSIS — R918 Other nonspecific abnormal finding of lung field: Secondary | ICD-10-CM | POA: Insufficient documentation

## 2016-12-14 DIAGNOSIS — I2584 Coronary atherosclerosis due to calcified coronary lesion: Principal | ICD-10-CM

## 2016-12-14 DIAGNOSIS — E785 Hyperlipidemia, unspecified: Secondary | ICD-10-CM | POA: Insufficient documentation

## 2016-12-14 DIAGNOSIS — R079 Chest pain, unspecified: Secondary | ICD-10-CM | POA: Insufficient documentation

## 2016-12-14 DIAGNOSIS — I2510 Atherosclerotic heart disease of native coronary artery without angina pectoris: Secondary | ICD-10-CM | POA: Insufficient documentation

## 2016-12-14 NOTE — Assessment & Plan Note (Signed)
She is very worried about this incidental finding on her left long. I promised that I would mention this in my note with interest in having her PCP follow-up on this with a repeat study in maybe 6 months just for her reassurance. She has had too much illness and sickness and family members to risk having something happened to her.

## 2016-12-14 NOTE — Assessment & Plan Note (Signed)
Her chest pain that she is describing to me is mostly related to the right rotator cuff with the pectoralis minor and supraspinatus muscles soreness. The left-sided chest wall pain is probably consistent with mild costochondritis.

## 2016-12-14 NOTE — Assessment & Plan Note (Addendum)
Her coronary CT scan showed some some evidence of cord calcification in the LAD. I reviewed the entire study with her in detail and explained that the space sleeping means that she does have some baseline coronary disease and that we want to ramp up the level of monitoring of risk factors.  I think we should probably start her on a statin based on her most recent lipid levels. Will start on 10 mg Crestor.  Don't think her chest pain was related to this lesion, but I think we need to treat her risk factors more aggressively.

## 2016-12-14 NOTE — Assessment & Plan Note (Signed)
We did get her labs after I saw her.  She knew that her total cholesterol was, high meld LDL is high. In light of her having coronary disease on CT scan that is mild, yet present given her young age. She has tried diet and exercise and still has had difficult control lipids. For now we will try low-dose Crestor. We also talked about coenzyme Q 10.

## 2017-01-03 ENCOUNTER — Telehealth: Payer: Self-pay | Admitting: Cardiology

## 2017-01-03 DIAGNOSIS — R911 Solitary pulmonary nodule: Secondary | ICD-10-CM

## 2017-01-03 NOTE — Telephone Encounter (Signed)
Will forward to dr harding for okay for referral

## 2017-01-03 NOTE — Telephone Encounter (Signed)
Spoke with pt, she has had a family member recently pass away from lung cancer that started as a lung nodule that was felt to not be a concern. She would just like piece of mind. Will forward to dr harding for okay to refer and if there is anyone in the [facility] he prefers.

## 2017-01-03 NOTE — Telephone Encounter (Signed)
Pt wants a referral from Dr Herbie BaltimoreHarding for a Lung Special[facility].Concerning the nodule found on her CT Scan.Please call today if possibel,she going out of town next week.

## 2017-01-08 NOTE — Telephone Encounter (Signed)
Ok - we can refer to Pulmonary Medicine to follow-up her lung nodule.  This is usually a PCP task though.   Bryan Lemmaavid Estella Malatesta, MD

## 2017-01-09 NOTE — Telephone Encounter (Signed)
Left message for patient, referral placed and she should call back with questions or if she is not contacted regarding an appointment.

## 2017-01-29 ENCOUNTER — Ambulatory Visit (INDEPENDENT_AMBULATORY_CARE_PROVIDER_SITE_OTHER): Payer: BLUE CROSS/BLUE SHIELD | Admitting: Pulmonary Disease

## 2017-01-29 ENCOUNTER — Encounter: Payer: Self-pay | Admitting: Pulmonary Disease

## 2017-01-29 VITALS — BP 108/64 | HR 92 | Ht 63.0 in | Wt 121.4 lb

## 2017-01-29 DIAGNOSIS — R911 Solitary pulmonary nodule: Secondary | ICD-10-CM | POA: Diagnosis not present

## 2017-01-29 DIAGNOSIS — IMO0001 Reserved for inherently not codable concepts without codable children: Secondary | ICD-10-CM

## 2017-01-29 DIAGNOSIS — R1012 Left upper quadrant pain: Secondary | ICD-10-CM | POA: Diagnosis not present

## 2017-01-29 DIAGNOSIS — R109 Unspecified abdominal pain: Secondary | ICD-10-CM | POA: Insufficient documentation

## 2017-01-29 NOTE — Progress Notes (Signed)
Subjective:    Patient ID: Kari Harper, female    DOB: 03/05/1971, 46 y.o.   MRN: [id number]  HPI  She reports she was having intermittent chest discomfort that she attributes to stress and reflux with caring for her mother. In her workup she underwent a cardiac CT that showed a lung nodule in her left lung. She has had multiple family members with malignancy. Denies any coughing, wheezing, or dyspnea. No fever, chills, or sweats. No adenopathy in her neck, groin, or axilla. No dysphagia or odynophagia recently. No joint swelling, pain, stiffness, or erythema. No melena or hematochezia. No abdominal pain recently. Patient reports she did have brief bright red blood in her stool after a food-borne illness but has since recovered. She intentionally has lost 4 pounds. No dysuria or hematuria. She has not yet had a colonoscopy. No abnormal PAP smears or abnormal mammograms. Her mammogram is due in August.  Review of Systems No rashes or abnormal bruising. No focal weakness, numbness, tingling, or focal vision loss. A pertinent 14 point review of systems is negative except as per the history of presenting illness.  No Known Allergies  Current Outpatient Prescriptions on File Prior to Visit  Medication Sig Dispense Refill  . ALPRAZolam (XANAX) 0.25 MG tablet 1 tablet by mouth up to three times daily. (Patient taking differently: at bedtime as needed for anxiety (ONLY WHEN GETTING ON FLIGHTS). 1 tablet by mouth up to three times daily.) 10 tablet 0  . rosuvastatin (CRESTOR) 10 MG tablet Take 1 tablet (10 mg total) by mouth daily. 90 tablet 3  . omeprazole (PRILOSEC) 20 MG capsule Take 1 capsule (20 mg total) by mouth daily. (Patient not taking: Reported on 01/29/2017) 30 capsule 0  . sucralfate (CARAFATE) 1 GM/10ML suspension Take 10 mLs (1 g total) by mouth 4 (four) times daily -  with meals and at bedtime. (Patient not taking: Reported on 01/29/2017) 420 mL 0   No current facility-administered  medications on file prior to visit.     Past Medical History:  Diagnosis Date  . Collar bone fracture    Age 8  . Fractured shoulder    Age 10  . GERD (gastroesophageal reflux disease)   . H/O varicella   . Hyperlipidemia   . Wrist fracture    Age 54   . Yeast infection     Past Surgical History:  Procedure Laterality Date  . THERAPEUTIC ABORTION     76yrs of age    Family History  Problem Relation Age of Onset  . Hypertension Mother   . Pancreatic cancer Mother        s/p Whipple   . Asthma Mother   . Cancer Father        brain  . Cancer Brother        testicular  . Pulmonary embolism Brother        occured during chemo     Social History   Social History  . Marital status: Married    Spouse name: N/A  . Number of children: N/A  . Years of education: N/A   Social History Main Topics  . Smoking status: Former Smoker    Years: 2.00    Types: Cigarettes  . Smokeless tobacco: Never Used     Comment: Social smoker while in college  . Alcohol use No  . Drug use: No     Comment: remote marijuana   . Sexual activity: Yes     Comment:  husband with vasectomy   Other Topics Concern  . None   Social History Narrative   [facility] (01/29/17):   [place] recently traveled to [place]. She has traveled to [place], [place] & [place]. 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 Has a dog & cat. Remote exposure to finches in the same house that she is living currently. No mold or hot tub exposure.       Objective:   Physical Exam BP 108/64 (BP Location: Left Arm, Cuff Size: Normal)   Pulse 92   Ht 5\' 3"  (1.6 m)   Wt 121 lb 6.4 oz (55.1 kg)   SpO2 100%   BMI 21.51 kg/m  General:  Awake. Alert. No acute distress. Thin, Caucasian female. Integument:  Warm & dry. No rash on exposed skin. No bruising on exposed skin. Extremities:  No cyanosis or clubbing.  Lymphatics:  No appreciated  cervical or supraclavicular lymphadenoapthy. HEENT:  Moist mucus membranes. No oral ulcers. No nasal turbinate swelling. No scleral icterus or injection. Cardiovascular:  Regular rate and rhythm. No edema. No appreciable JVD.  Pulmonary:  Good aeration & clear to auscultation bilaterally. Symmetric chest wall expansion. No accessory muscle use on room air. Abdomen: Soft. Normal bowel sounds. Nondistended. No hepatosplenomegaly. Tenderness to deep palpation in left upper quadrant. Musculoskeletal:  Normal bulk and tone. Hand grip strength 5/5 bilaterally. No joint deformity or effusion appreciated. Neurological:  CN 2-12 grossly in tact. No meningismus. Moving all 4 extremities equally. Symmetric brachioradialis deep tendon reflexes. Psychiatric:  Mood and affect congruent. Speech normal rhythm, rate & tone.   IMAGING CARDIAC CT 12/06/16 (personally reviewed by me):  4 mm nodule present within superior segment of the left lower lobe. No pleural effusion or thickening appreciated. No mass or pathologically enlarged mediastinal lymph node on limited windows. No pericardial effusion. Questionable enlargement of spleen with incomplete abdominal imaging.    Assessment & Plan:  46 y.o. female with incidental finding of 4 mm nodule within the left lower lobe. I personally reviewed her CT imaging with her today. There is some questionable splenic enlargement on patient's incomplete abdominal windows which could be the source for her intermittent left upper quadrant abdominal pain. This could be some residual symptomatology from her previous foodborne illness, especially given the temporal association with the timing of her illness. With patient's multiple potential exposures such as birds, clay, and international travel I suspect that the nodule represents an area of fibrosis. According to the [facility] solitary pulmonary nodule risk calculator her risk of malignancy is approximately 2-3%. Given the patient's family  history of malignancy in multiple family members I feel it's reasonable to continue to monitor as per her wishes.  1. Left lower lobe nodule:  Checking CT chest without contrast to evaluate the remainder of the patient's lung parenchyma. Further timing of imaging based upon findings. 2. Left upper quadrant pain: Checking complete abdominal ultrasound. 3. Follow-up: Return to clinic in 6 weeks or sooner if needed.  Donna ChristenJennings E. Jamison NeighborNestor, M.D. [facility] Pager:  [phone] After 3pm or if no response, call [phone] 3:14 PM 01/29/17

## 2017-01-29 NOTE — Patient Instructions (Signed)
   Call or e-mail me if you have any new breathing problems or questions before your next appointment.  We will review your CT scan result when I see you back.  TESTS ORDERED: 1. CT Scan Chest w/o 2. Complete Abdominal U/S

## 2017-02-05 ENCOUNTER — Ambulatory Visit (HOSPITAL_COMMUNITY)
Admission: RE | Admit: 2017-02-05 | Discharge: 2017-02-05 | Disposition: A | Discharge status: Home / Self Care | Payer: BLUE CROSS/BLUE SHIELD | Source: Ambulatory Visit | Attending: Pulmonary Disease | Admitting: Pulmonary Disease

## 2017-02-05 ENCOUNTER — Encounter (HOSPITAL_COMMUNITY): Payer: Self-pay

## 2017-02-05 DIAGNOSIS — R911 Solitary pulmonary nodule: Secondary | ICD-10-CM | POA: Diagnosis not present

## 2017-02-05 DIAGNOSIS — I2510 Atherosclerotic heart disease of native coronary artery without angina pectoris: Secondary | ICD-10-CM | POA: Insufficient documentation

## 2017-02-05 DIAGNOSIS — R918 Other nonspecific abnormal finding of lung field: Secondary | ICD-10-CM | POA: Diagnosis not present

## 2017-02-05 DIAGNOSIS — R1012 Left upper quadrant pain: Secondary | ICD-10-CM

## 2017-02-05 DIAGNOSIS — IMO0001 Reserved for inherently not codable concepts without codable children: Secondary | ICD-10-CM

## 2017-02-05 DIAGNOSIS — I700 Atherosclerosis of aorta: Secondary | ICD-10-CM | POA: Insufficient documentation

## 2017-03-12 ENCOUNTER — Ambulatory Visit (INDEPENDENT_AMBULATORY_CARE_PROVIDER_SITE_OTHER): Payer: BLUE CROSS/BLUE SHIELD | Admitting: Pulmonary Disease

## 2017-03-12 ENCOUNTER — Encounter: Payer: Self-pay | Admitting: Pulmonary Disease

## 2017-03-12 VITALS — BP 116/76 | HR 74 | Ht 63.0 in | Wt 123.0 lb

## 2017-03-12 DIAGNOSIS — R918 Other nonspecific abnormal finding of lung field: Secondary | ICD-10-CM

## 2017-03-12 NOTE — Progress Notes (Signed)
Subjective:    Patient ID: Kari Harper, female    DOB: 06-24-1971, 46 y.o.   MRN: [id number]  [facility]  Follow-up for Multiple Pulmonary Nodules & Abdominal Pain.   HPI  Multiple Pulmonary Nodules: Initial lung nodule seen on cardiac CT in April. Subsequent chest CT showed subcentimeter subpleural nodules bilaterally. No dyspnea, coughing, or wheezing.  Abdominal Pain: Previously present in left upper quadrant. Complete abdominal ultrasound showed no abnormality. She reports her abdominal pain has resolved.   Review of Systems No chest pain or pressure. No fever or chills. No abdominal pain or nausea.   No Known Allergies  Current Outpatient Prescriptions on File Prior to Visit  Medication Sig Dispense Refill  . ALPRAZolam (XANAX) 0.25 MG tablet 1 tablet by mouth up to three times daily. (Patient taking differently: at bedtime as needed for anxiety (ONLY WHEN GETTING ON FLIGHTS). 1 tablet by mouth up to three times daily.) 10 tablet 0  . omeprazole (PRILOSEC) 20 MG capsule Take 20 mg by mouth daily as needed.    . rosuvastatin (CRESTOR) 10 MG tablet Take 1 tablet (10 mg total) by mouth daily. 90 tablet 3  . sucralfate (CARAFATE) 1 GM/10ML suspension Take 10 mLs (1 g total) by mouth 4 (four) times daily -  with meals and at bedtime. 420 mL 0   No current facility-administered medications on file prior to visit.     Past Medical History:  Diagnosis Date  . Collar bone fracture    Age 46  . Fractured shoulder    Age 46  . GERD (gastroesophageal reflux disease)   . H/O varicella   . Hyperlipidemia   . Wrist fracture    Age 46   . Yeast infection     Past Surgical History:  Procedure Laterality Date  . THERAPEUTIC ABORTION     89yrs of age    Family History  Problem Relation Age of Onset  . Hypertension Mother   . Pancreatic cancer Mother        s/p Whipple   . Asthma Mother   . Cancer Father        brain  . Cancer Brother        testicular  . Pulmonary embolism  Brother        occured during chemo     Social History   Social History  . Marital status: Married    Spouse name: N/A  . Number of children: N/A  . Years of education: N/A   Social History Main Topics  . Smoking status: Former Smoker    Years: 2.00    Types: Cigarettes  . Smokeless tobacco: Never Used     Comment: Social smoker while in college  . Alcohol use No  . Drug use: No     Comment: remote marijuana   . Sexual activity: Yes     Comment: husband with vasectomy   Other Topics Concern  . None   Social History Narrative   [facility] (01/29/17):   [place] She recently traveled to [place]. She has traveled to [place], [place], & [place]. 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 Has a dog & cat. Remote exposure to finches in the same house that she is living currently. No mold or hot tub exposure.       Objective:   Physical Exam BP 116/76 (BP Location: Left Arm, Cuff Size: Normal)  Pulse 74   Ht 5\' 3"  (1.6 m)   Wt 123 lb (55.8 kg)   SpO2 98%   BMI 21.79 kg/m   General:  Awake. No distress. Caucasian female. Integument:  Warm & dry. No rash on exposed skin. No bruising on exposed skin. Extremities:  No cyanosis or clubbing.  HEENT:  Moist mucus membranes. Minimal nasal turbinate swelling. No scleral icterus. Cardiovascular:  Regular rate. No edema. Regular rhythm.  Pulmonary:  Clear to auscultation bilaterally. Normal work of breathing on room air. Abdomen: Soft. Normal bowel sounds. Nondistended.  Musculoskeletal:  Normal bulk and tone. No joint deformity or effusion appreciated.  IMAGING CT CHEST W/O 02/05/17 (personally reviewed by me):  No pathologic mediastinal or hilar adenopathy. No pleural effusion or thickening. No pericardial effusion. Multiple scattered nodules measuring up to 4 mm in maximal dimension.  COMPLETE ABDOMINAL U/S 02/05/17 (per radiologist):  No acute  abnormality.  CARDIAC CT 12/06/16 (previously reviewed by me):  4 mm nodule present within superior segment of the left lower lobe. No pleural effusion or thickening appreciated. No mass or pathologically enlarged mediastinal lymph node on limited windows. No pericardial effusion. Questionable enlargement of spleen with incomplete abdominal imaging.    Assessment & Plan:  46 y.o. female with multiple pulmonary nodules & left upper quadrant abdominal pain. I reviewed the patient's CT scan with her today. Given her significant family history of malignancy we will repeat a CT chest without contrast 1 year from her CT scan in June. Her abdominal pain has resolved and was likely due to previously ingested food while traveling. I instructed the patient to contact my office if she had any new breathing problems or questions before her next appointment.  1. Multiple pulmonary nodules: Plan for repeat CT chest without contrast June 2019. 2. Abdominal pain: Resolved. 3. Follow-up: Return to clinic in 11 months or sooner if needed.  Donna ChristenJennings E. Jamison NeighborNestor, M.D. [facility] Pager:  [phone] After 3pm or if no response, call [phone] 11:32 AM 03/12/17

## 2017-03-12 NOTE — Patient Instructions (Signed)
   Please call or e-mail me if you have any new breathing problems or questions before your next appointment.  I will see you back in 1 year after your chest CT scan or sooner if needed.  TESTS ORDERED: 1. CT CHEST W/O June 2019

## 2018-01-19 ENCOUNTER — Telehealth: Payer: Self-pay | Admitting: Adult Health

## 2018-01-19 DIAGNOSIS — R918 Other nonspecific abnormal finding of lung field: Secondary | ICD-10-CM

## 2018-01-19 NOTE — Telephone Encounter (Signed)
Former JN pt has an upcoming CT chest that needs to be done in late 01/2018 per JN. Because JN is no longer with our practice then a new order will be placed under Rubye Oaksammy Parrett, NP, per office protocol. Within the order will explain the need for an OV after CT chest to discuss results. Order placed.   Will forward to TP to make her aware.

## 2018-01-19 NOTE — Telephone Encounter (Signed)
[facility] & when I called to tell her she stated she just came into town at midnight last night & would be leaving again on 6/28 & would be out of town in July & August.  She stated she would check her schedule at work tomorrow & then call me back.  I canceled her appt for 6/28 & will wait for her phone call to reschedule.

## 2018-02-06 ENCOUNTER — Other Ambulatory Visit: Payer: BLUE CROSS/BLUE SHIELD

## 2018-04-07 ENCOUNTER — Encounter: Payer: Self-pay | Admitting: *Deleted

## 2018-04-07 ENCOUNTER — Telehealth: Payer: Self-pay | Admitting: Adult Health

## 2018-04-07 NOTE — Telephone Encounter (Signed)
Per TP: don't close the order; patient needs to be sent a letter

## 2018-04-07 NOTE — Telephone Encounter (Signed)
E-mail and letter sent to patient Will sign off

## 2018-04-07 NOTE — Telephone Encounter (Signed)
This is pt of JN.  He had ordered CT for June 2019 & new order was put in TP's name.  I scheduled pt for a CT on 6/28.  She stated that wasn't a good date and asked me to cancel & she would call me back to reschedule.  She eventually stated she had a lot going on with her mom and son & asked if I would call her back in August to schedule & hopefully things will have calmed down for her by then.  I have called pt twice in the past week & left vm's with no response.  Just wanted to make you aware & see what you would like to do.  Should order be closed?

## 2018-07-06 ENCOUNTER — Ambulatory Visit (INDEPENDENT_AMBULATORY_CARE_PROVIDER_SITE_OTHER)
Admission: RE | Admit: 2018-07-06 | Discharge: 2018-07-06 | Disposition: A | Discharge status: Home / Self Care | Payer: BLUE CROSS/BLUE SHIELD | Source: Ambulatory Visit | Attending: Adult Health | Admitting: Adult Health

## 2018-07-06 DIAGNOSIS — R918 Other nonspecific abnormal finding of lung field: Secondary | ICD-10-CM | POA: Diagnosis not present

## 2018-07-13 ENCOUNTER — Encounter: Payer: Self-pay | Admitting: Pulmonary Disease

## 2018-07-13 ENCOUNTER — Ambulatory Visit: Payer: BLUE CROSS/BLUE SHIELD | Admitting: Pulmonary Disease

## 2018-07-13 VITALS — BP 118/78 | HR 90 | Ht 62.0 in | Wt 129.4 lb

## 2018-07-13 DIAGNOSIS — R918 Other nonspecific abnormal finding of lung field: Secondary | ICD-10-CM

## 2018-07-13 NOTE — Progress Notes (Signed)
Synopsis: Establish care in December for 2019. Former patient of Dr. Celene Skeen.   Subjective:   PATIENT ID: Kari Harper GENDER: female DOB: 06-Dec-1970, MRN: [id number]  Chief Complaint  Patient presents with  . Follow-up    Needs to discuss Ct results, former [facility] patient.     This is a 47 year old female, past medical history gastroesophageal reflux disease, hyperlipidemia.  Had CT coronary which revealed a small pulmonary nodule which led to subsequent CT imaging of the chest.  Initial CT imaging of the chest was in June 2018 this revealed tiny subpleural nodules bilaterally that were considered possible intrapulmonary lymph nodes.  She subsequently had a follow-up in November 2019 they were considered stable and unchanged.  Overall she has no respiratory complaints today.  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  She does have a few hobbies that involve medical exposure to include pottery and glazing and painting.  She routinely wears a mask while she is working in her shop.  Today she does have a few questions regarding marijuana use as well as tobacco abuse and how they affect lung function.  Was discussed in the office.   Past Medical History:  Diagnosis Date  . Collar bone fracture    Age 29  . Fractured shoulder    Age 89  . GERD (gastroesophageal reflux disease)   . H/O varicella   . Hyperlipidemia   . Wrist fracture    Age 49   . Yeast infection      Family History  Problem Relation Age of Onset  . Hypertension Mother   . Pancreatic cancer Mother        s/p Whipple   . Asthma Mother   . Cancer Father        brain  . Cancer Brother        testicular  . Pulmonary embolism Brother        occured during chemo      Past Surgical History:  Procedure Laterality Date  . THERAPEUTIC ABORTION     16yrs of age    Social History   Socioeconomic History  . Marital status: Married    Spouse name: Not on file  . Number  of children: Not on file  . Years of education: Not on file  . Highest education level: Not on file  Occupational History  . Not on file  Social Needs  . Financial resource strain: Not on file  . Food insecurity:    Worry: Not on file    Inability: Not on file  . Transportation needs:    Medical: Not on file    Non-medical: Not on file  Tobacco Use  . Smoking status: Former Smoker    Years: 2.00    Types: Cigarettes  . Smokeless tobacco: Never Used  . Tobacco comment: Social smoker while in college  Substance and Sexual Activity  . Alcohol use: No  . Drug use: No    Comment: remote marijuana   . Sexual activity: Yes    Comment: husband with vasectomy  Lifestyle  . Physical activity:    Days per week: Not on file    Minutes per session: Not on file  . Stress: Not on file  Relationships  . Social connections:    Talks on phone: Not on file    Gets together: Not on file    Attends religious service: Not on file    Active  member of club or organization: Not on file    Attends meetings of clubs or organizations: Not on file    Relationship status: Not on file  . Intimate partner violence:    Fear of current or ex partner: Not on file    Emotionally abused: Not on file    Physically abused: Not on file    Forced sexual activity: Not on file  Other Topics Concern  . Not on file  Social History Narrative   [facility] (01/29/17):   [place] She recently traveled to [place]. She has traveled to [place], [place] & [place]. 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 Has a dog & cat. Remote exposure to finches in the same house that she is living currently. No mold or hot tub exposure.      No Known Allergies   Outpatient Medications Prior to Visit  Medication Sig Dispense Refill  . ALPRAZolam (XANAX) 0.25 MG tablet 1 tablet by mouth up to three times daily. (Patient taking differently: at bedtime as  needed for anxiety (ONLY WHEN GETTING ON FLIGHTS). 1 tablet by mouth up to three times daily.) 10 tablet 0  . omeprazole (PRILOSEC) 20 MG capsule Take 20 mg by mouth daily as needed.    . rosuvastatin (CRESTOR) 10 MG tablet Take 1 tablet (10 mg total) by mouth daily. 90 tablet 3  . sucralfate (CARAFATE) 1 GM/10ML suspension Take 10 mLs (1 g total) by mouth 4 (four) times daily -  with meals and at bedtime. 420 mL 0   No facility-administered medications prior to visit.     Review of Systems  Constitutional: Negative for chills, fever, malaise/fatigue and weight loss.  HENT: Negative for hearing loss, sore throat and tinnitus.   Eyes: Negative for blurred vision and double vision.  Respiratory: Negative for cough, hemoptysis, sputum production, shortness of breath, wheezing and stridor.   Cardiovascular: Negative for chest pain, palpitations, orthopnea, leg swelling and PND.  Gastrointestinal: Negative for abdominal pain, constipation, diarrhea, heartburn, nausea and vomiting.  Genitourinary: Negative for dysuria, hematuria and urgency.  Musculoskeletal: Negative for joint pain and myalgias.  Skin: Negative for itching and rash.  Neurological: Negative for dizziness, tingling, weakness and headaches.  Endo/Heme/Allergies: Negative for environmental allergies. Does not bruise/bleed easily.  Psychiatric/Behavioral: Negative for depression. The patient is not nervous/anxious and does not have insomnia.   All other systems reviewed and are negative.    Objective:  Physical Exam  Constitutional: She is oriented to person, place, and time. She appears well-developed and well-nourished. No distress.  HENT:  Head: Normocephalic and atraumatic.  Mouth/Throat: Oropharynx is clear and moist.  Eyes: Pupils are equal, round, and reactive to light. Conjunctivae are normal. No scleral icterus.  Neck: Neck supple. No JVD present. No tracheal deviation present.  Cardiovascular: Normal rate, regular  rhythm, normal heart sounds and intact distal pulses.  No murmur heard. Pulmonary/Chest: Effort normal and breath sounds normal. No accessory muscle usage or stridor. No tachypnea. No respiratory distress. She has no wheezes. She has no rhonchi. She has no rales.  Abdominal: Soft. Bowel sounds are normal. She exhibits no distension. There is no tenderness.  Musculoskeletal: She exhibits no edema or tenderness.  Lymphadenopathy:    She has no cervical adenopathy.  Neurological: She is alert and oriented to person, place, and time.  Skin: Skin is warm and dry. Capillary refill takes less than 2 seconds. No  rash noted.  Psychiatric: She has a normal mood and affect. Her behavior is normal.  Vitals reviewed.   Vitals:   07/13/18 0945  BP: 118/78  Pulse: 90  SpO2: 97%  Weight: 129 lb 6.4 oz (58.7 kg)  Height: 5\' 2"  (1.575 m)   97% on RA BMI Readings from Last 3 Encounters:  07/13/18 23.67 kg/m  03/12/17 21.79 kg/m  01/29/17 21.51 kg/m   Wt Readings from Last 3 Encounters:  07/13/18 129 lb 6.4 oz (58.7 kg)  03/12/17 123 lb (55.8 kg)  01/29/17 121 lb 6.4 oz (55.1 kg)     CBC    Component Value Date/Time   WBC 7.2 11/20/2016 1657   RBC 4.53 11/20/2016 1657   HGB 13.3 11/20/2016 1657   HCT 38.1 11/20/2016 1657   PLT 226 11/20/2016 1657   MCV 84.1 11/20/2016 1657   MCH 29.4 11/20/2016 1657   MCHC 34.9 11/20/2016 1657   RDW 12.1 11/20/2016 1657    Chest Imaging: Ct Chest 07/06/2018: No change in tiny bilateral subpleural and perifissural nodules in a 78-month interval since prior CT imaging.  These were considered benign at this point.  No additional follow-up needed. The patient's images have been independently reviewed by me.    Pulmonary Functions Testing Results: No flowsheet data found.  FeNO: None   Pathology: None   Echocardiogram: None   Heart Catheterization: None     Assessment & Plan:   Multiple pulmonary nodules  Discussion:  This is a  47 year old lady that was diagnosed with bilateral multiple pulmonary nodules in 2018.  She is here today for follow-up of CT imaging that was completed in November 2019.  The nodules are stable and therefore since they have been stable for 17+ months would recommend no additional follow-up as they are considered benign.  Review of the images reveal peri-fissural involvement likely consistent with intrapulmonary lymph node.  Patient was counseled on marijuana and tobacco use and how they affect the lung in the office today.  She had several questions mainly regarding having a better educated discussion with her children at home.  All of these questions were answered.  Greater than 50% of this patient's 25-minute office visit was spent face-to-face discussing the above recommendations and treatment plan.  Return to clinic as needed or if symptoms worsen.   Current Outpatient Medications:  .  ALPRAZolam (XANAX) 0.25 MG tablet, 1 tablet by mouth up to three times daily. (Patient taking differently: at bedtime as needed for anxiety (ONLY WHEN GETTING ON FLIGHTS). 1 tablet by mouth up to three times daily.), Disp: 10 tablet, Rfl: 0 .  omeprazole (PRILOSEC) 20 MG capsule, Take 20 mg by mouth daily as needed., Disp: , Rfl:  .  rosuvastatin (CRESTOR) 10 MG tablet, Take 1 tablet (10 mg total) by mouth daily., Disp: 90 tablet, Rfl: 3 .  sucralfate (CARAFATE) 1 GM/10ML suspension, Take 10 mLs (1 g total) by mouth 4 (four) times daily -  with meals and at bedtime., Disp: 420 mL, Rfl: 0   Josephine Igo, DO  Pulmonary Critical Care 07/13/2018 11:25 AM

## 2018-07-13 NOTE — Patient Instructions (Addendum)
Thank you for visiting Dr. Icard at [facility]. Today we recommend the following:    Return if symptoms worsen or fail to improve.  

## 2018-10-22 IMAGING — CR DG CHEST 2V
2 series · 2 of 2 positions shown · non-contrast
Comparison: 11/18/2016

CLINICAL DATA: Acute onset chest pain radiating to neck.

EXAM:
CHEST  2 VIEW

[chest pa]
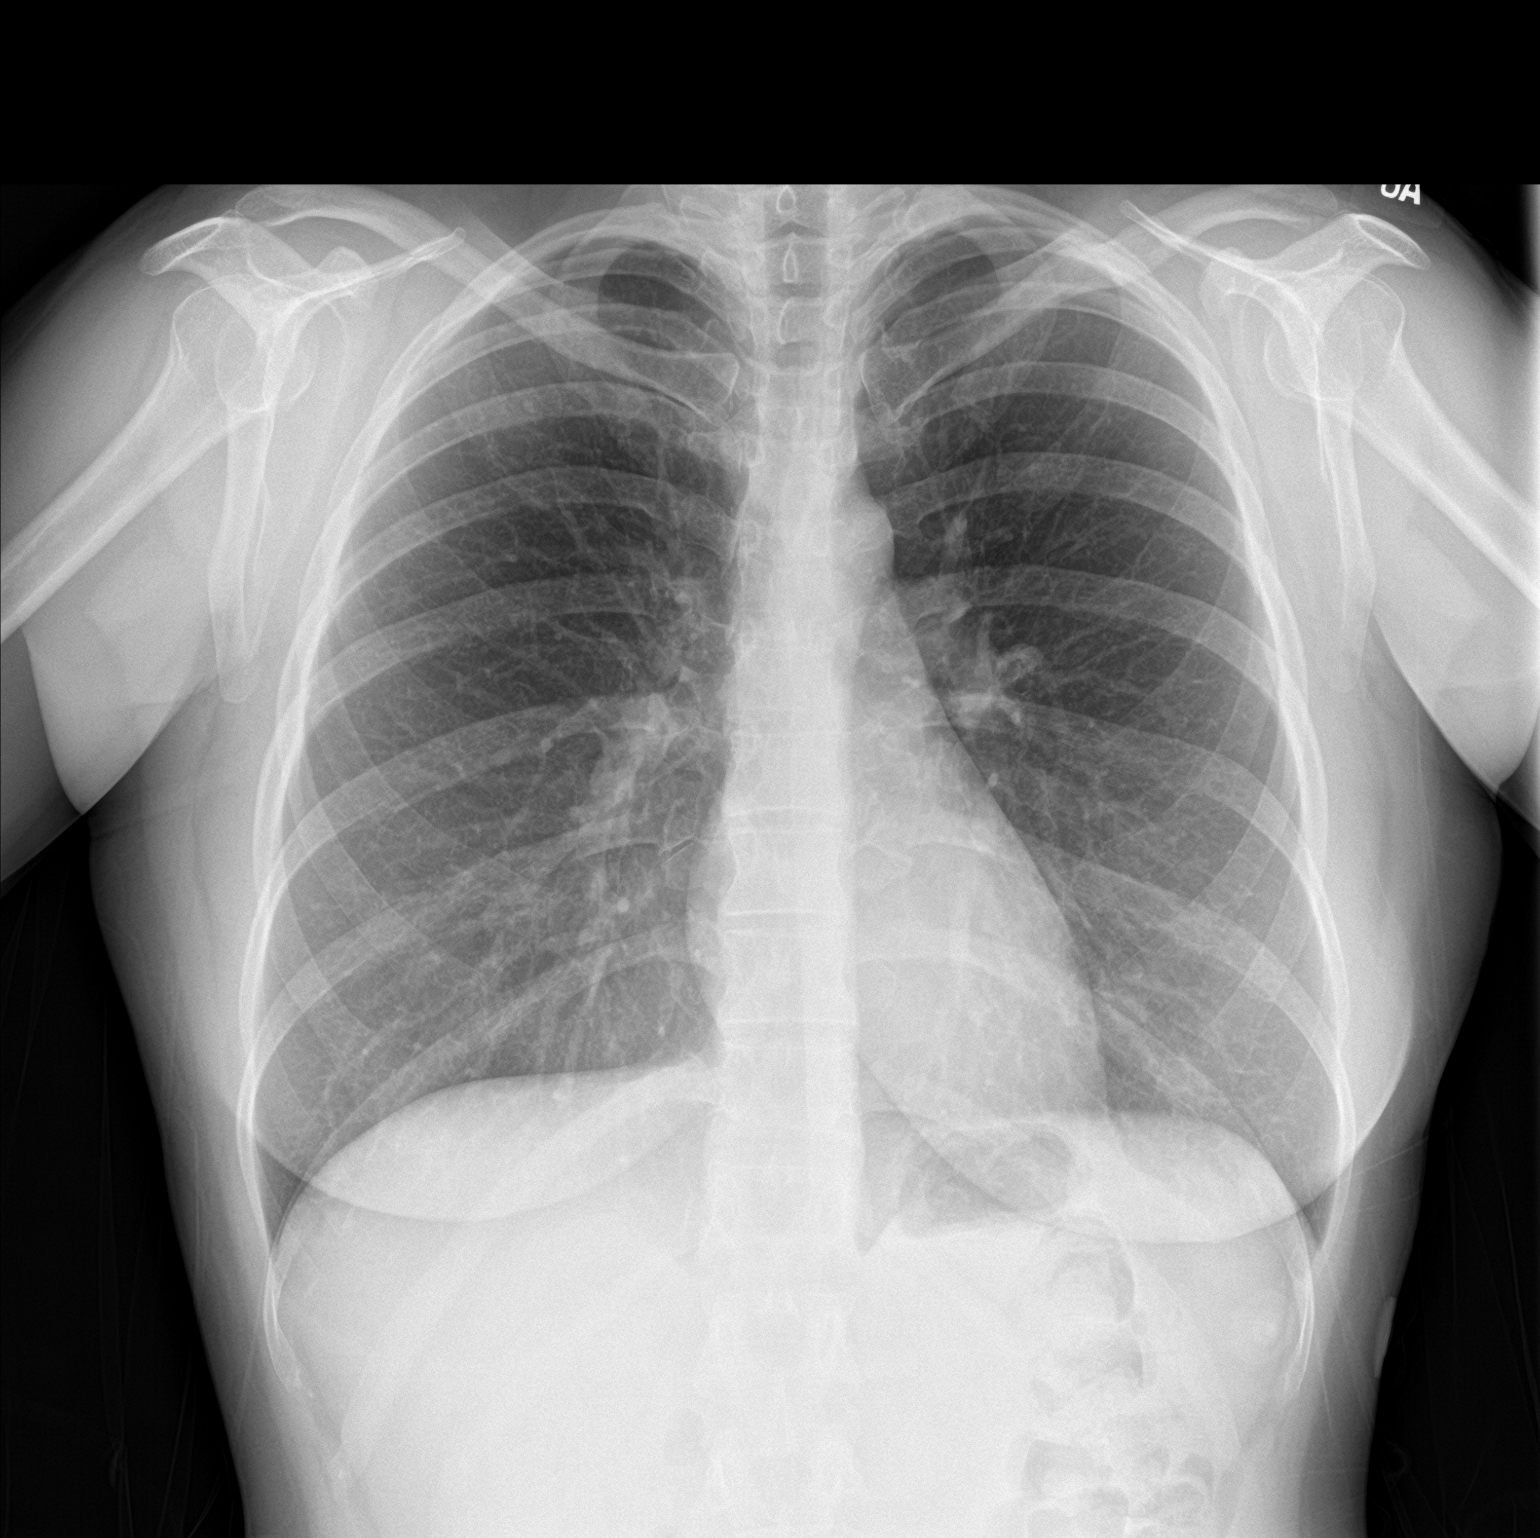

[chest lat]
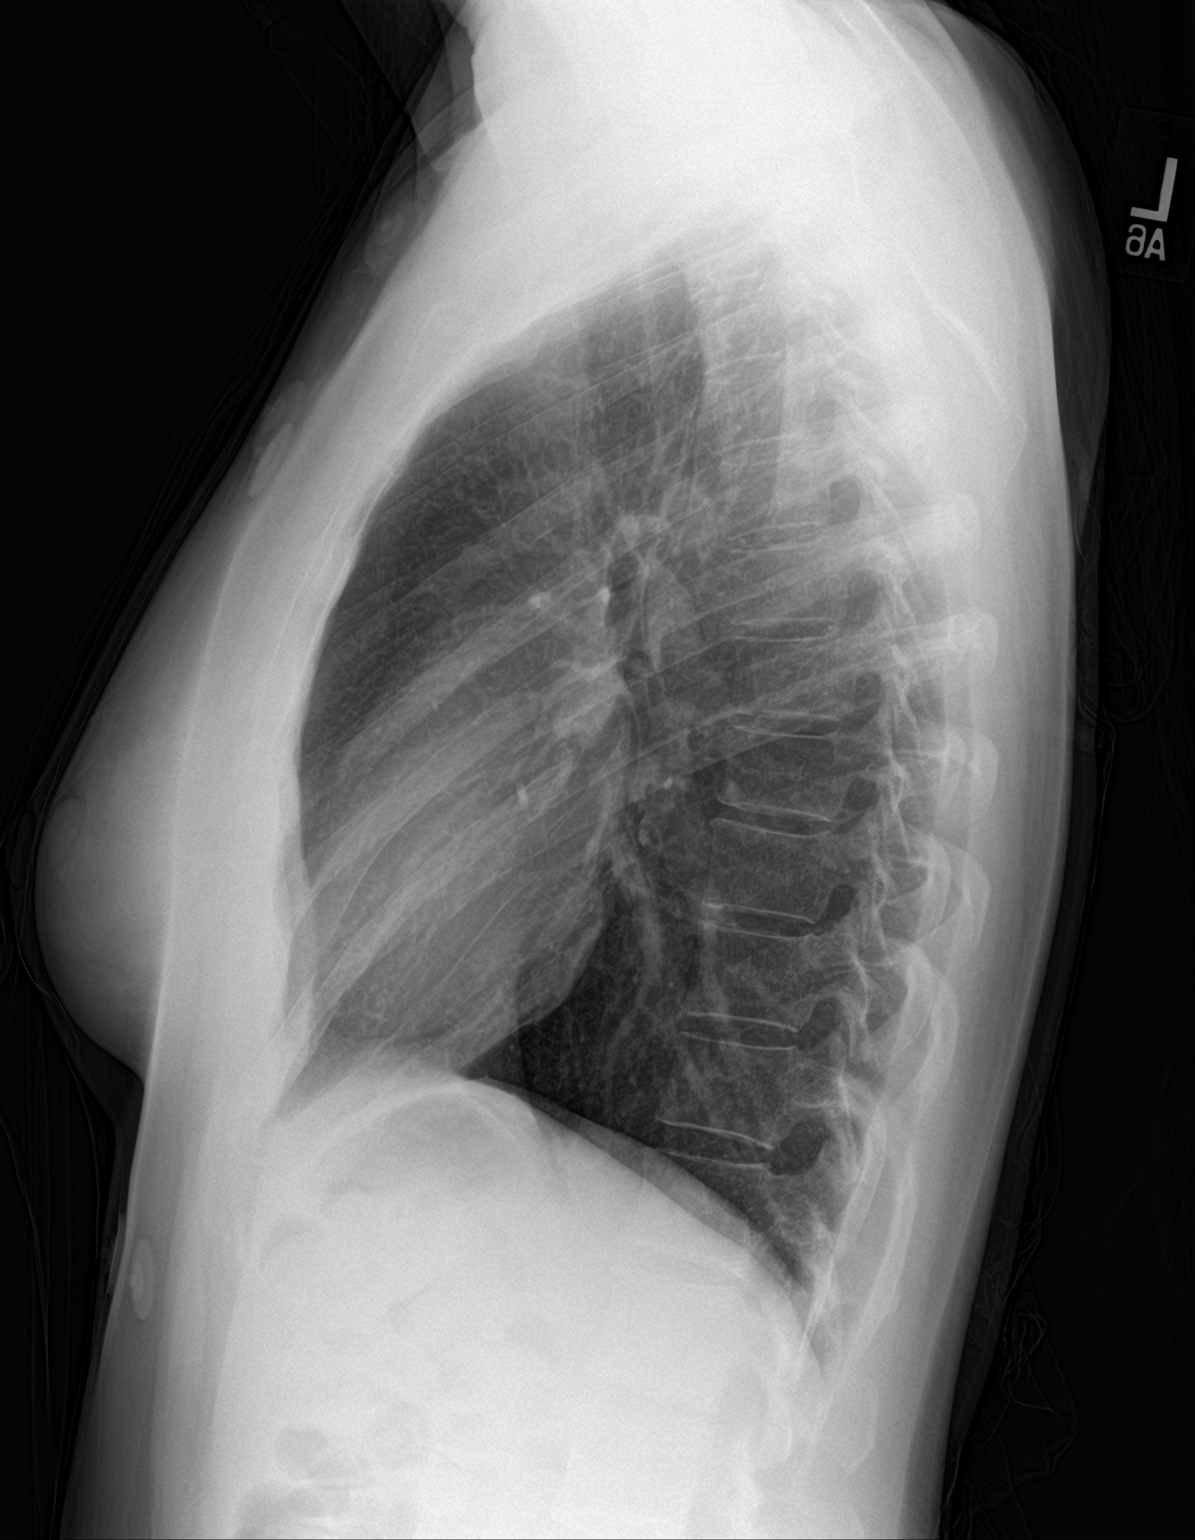

[2 of 2 positions shown; findings below may reference images not displayed]

FINDINGS: The heart size and mediastinal contours are within normal limits.
Both lungs are clear. The visualized skeletal structures are
unremarkable.
IMPRESSION: Negative.  No active cardiopulmonary disease.

## 2020-02-20 ENCOUNTER — Encounter (HOSPITAL_COMMUNITY): Payer: Self-pay | Admitting: Emergency Medicine

## 2020-02-20 ENCOUNTER — Emergency Department (HOSPITAL_BASED_OUTPATIENT_CLINIC_OR_DEPARTMENT_OTHER): Payer: BC Managed Care – PPO

## 2020-02-20 ENCOUNTER — Ambulatory Visit (HOSPITAL_COMMUNITY)
Admission: EM | Admit: 2020-02-20 | Discharge: 2020-02-20 | Disposition: A | Discharge status: Home / Self Care | Payer: BC Managed Care – PPO | Source: Home / Self Care

## 2020-02-20 ENCOUNTER — Emergency Department (HOSPITAL_COMMUNITY)
Admission: EM | Admit: 2020-02-20 | Discharge: 2020-02-20 | Disposition: A | Discharge status: Home / Self Care | Payer: BC Managed Care – PPO | Attending: Emergency Medicine | Admitting: Emergency Medicine

## 2020-02-20 ENCOUNTER — Other Ambulatory Visit: Payer: Self-pay

## 2020-02-20 DIAGNOSIS — M79604 Pain in right leg: Secondary | ICD-10-CM

## 2020-02-20 DIAGNOSIS — Z79899 Other long term (current) drug therapy: Secondary | ICD-10-CM | POA: Insufficient documentation

## 2020-02-20 DIAGNOSIS — Z87891 Personal history of nicotine dependence: Secondary | ICD-10-CM | POA: Diagnosis not present

## 2020-02-20 DIAGNOSIS — I2510 Atherosclerotic heart disease of native coronary artery without angina pectoris: Secondary | ICD-10-CM | POA: Diagnosis not present

## 2020-02-20 LAB — CBG MONITORING, ED: Glucose-Capillary: 79 mg/dL (ref 70–99)

## 2020-02-20 NOTE — ED Notes (Signed)
Pt updated on wait.  °

## 2020-02-20 NOTE — Progress Notes (Signed)
VASCULAR LAB    Bilateral lower extremity venous duplex completed.    Preliminary report:  See CV proc for preliminary results.  Kelby Aline, RN, results.  Rumi Taras, RVT 02/20/2020, 5:45 PM

## 2020-02-20 NOTE — ED Notes (Signed)
Patient verbalizes understanding of discharge instructions. Opportunity for questioning and answers were provided. Armband removed by staff, pt discharged from ED stable & ambulatory  

## 2020-02-20 NOTE — ED Provider Notes (Signed)
[facility] EMERGENCY DEPARTMENT Provider Note   CSN: [id number] Arrival date & time: 02/20/20  1553     History Chief Complaint  Patient presents with  . Leg Pain    Kari Harper is a 49 y.o. female presents with right leg tightness. She states that she's noticed that behind her R knee and calf it has been slightly tender and sore. 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 She has been relatively active and went on vacation last week and did some hiking. She is the owner of a retail shop and was standing for most of the day yesterday. She reports associated tingling in the foot at times. She also has been having some tightness in the left calf but it's really her right leg that is bothering her. She denies back pain, leg weakness, urinary symptoms, dark urine. She feels slightly dehydrated.   HPI     Past Medical History:  Diagnosis Date  . Collar bone fracture    Age 33  . Fractured shoulder    Age 7  . GERD (gastroesophageal reflux disease)   . H/O varicella   . Hyperlipidemia   . Wrist fracture    Age 55   . Yeast infection     Patient Active Problem List   Diagnosis Date Noted  . Abdominal pain 01/29/2017  . Chest pain with low risk for cardiac etiology 12/14/2016  . Coronary artery calcification of native artery 12/14/2016  . Dyslipidemia 12/14/2016  . Multiple pulmonary nodules 12/14/2016  . Screening for malignant neoplasm of the cervix 01/14/2012  . Anxiety 01/14/2012    Past Surgical History:  Procedure Laterality Date  . THERAPEUTIC ABORTION     62yrs of age     69 History    Gravida  75   Para  2   Term  2   Preterm      AB  1   Living  2     SAB      TAB  1   Ectopic      Multiple      Live Births  2           Family History  Problem Relation Age of Onset  . Hypertension Mother   . Pancreatic cancer Mother         s/p Whipple   . Asthma Mother   . Cancer Father        brain  . Cancer Brother        testicular  . Pulmonary embolism Brother        occured during chemo     Social History   Tobacco Use  . Smoking status: Former Smoker    Years: 2.00    Types: Cigarettes  . Smokeless tobacco: Never Used  . Tobacco comment: Social smoker while in college  Vaping Use  . Vaping Use: Never used  Substance Use Topics  . Alcohol use: No  . Drug use: No    Comment: remote marijuana     Home Medications Prior to Admission medications   Medication Sig Start Date End Date Taking? Authorizing Provider  ALPRAZolam (XANAX) 0.25 MG tablet 1 tablet by mouth up to three times daily. Patient taking differently: at bedtime as needed for anxiety (ONLY WHEN GETTING ON FLIGHTS). 1 tablet by mouth up to three times daily. 01/14/12   Haygood,  Maris BergerVanessa P, MD  omeprazole (PRILOSEC) 20 MG capsule Take 20 mg by mouth daily as needed.    [provider]  rosuvastatin (CRESTOR) 10 MG tablet Take 1 tablet (10 mg total) by mouth daily. 12/12/16   Marykay LexHarding, David W, MD  sucralfate (CARAFATE) 1 GM/10ML suspension Take 10 mLs (1 g total) by mouth 4 (four) times daily -  with meals and at bedtime. 11/20/16   Long, Arlyss RepressJoshua G, MD    Allergies    Patient has no known allergies.  Review of Systems   Review of Systems  Musculoskeletal: Positive for myalgias. Negative for arthralgias and back pain.  Neurological: Positive for numbness (tingling). Negative for weakness.  All other systems reviewed and are negative.   Physical Exam Updated Vital Signs BP 117/86   Pulse 80   Temp 98.5 F (36.9 C)   Resp 17   Ht 5\' 2"  (1.575 m)   Wt 53.5 kg   LMP 01/21/2020   SpO2 100%   BMI 21.58 kg/m   Physical Exam Vitals and nursing note reviewed.  Constitutional:      General: She is not in acute distress.    Appearance: Normal appearance. She is well-developed. She is not ill-appearing.     Comments: Well appearing  female in NAD  HENT:     Head: Normocephalic and atraumatic.  Eyes:     General: No scleral icterus.       Right eye: No discharge.        Left eye: No discharge.     Conjunctiva/sclera: Conjunctivae normal.     Pupils: Pupils are equal, round, and reactive to light.  Cardiovascular:     Rate and Rhythm: Normal rate.  Pulmonary:     Effort: Pulmonary effort is normal. No respiratory distress.  Abdominal:     General: There is no distension.  Musculoskeletal:     Cervical back: Normal range of motion.     Comments: No back tenderness. Normal ROM of the hip, knee, ankle foot. No obvious leg swelling noted. Compartments are soft. DP pulses are not palpable but were doppler-able.  Skin:    General: Skin is warm and dry.  Neurological:     Mental Status: She is alert and oriented to person, place, and time.  Psychiatric:        Behavior: Behavior normal.     ED Results / Procedures / Treatments   Labs (all labs ordered are listed, but only abnormal results are displayed) Labs Reviewed  CBG MONITORING, ED    EKG None  Radiology VAS US LOWER EXTREMITY VENOUS (DVT) (ONLY [facility] & [facility])  Result Date: 02/20/2020  Lower Venous DVTStudy Indications: Tightness of calf. Family history of clotting disorder.  Limitations: Small vessels. Comparison Study: No prior study on file Performing Technologist: Sherren Kernsandace Kanady RVS  Examination Guidelines: A complete evaluation includes B-mode imaging, spectral Doppler, color Doppler, and power Doppler as needed of all accessible portions of each vessel. Bilateral testing is considered an integral part of a complete examination. Limited examinations for reoccurring indications may be performed as noted. The reflux portion of the exam is performed with the patient in reverse Trendelenburg.  +---------+---------------+---------+-----------+----------+--------------+ RIGHT    CompressibilityPhasicitySpontaneityPropertiesThrombus Aging  +---------+---------------+---------+-----------+----------+--------------+ CFV      Full           Yes      Yes                                 +---------+---------------+---------+-----------+----------+--------------+  SFJ      Full                                                        +---------+---------------+---------+-----------+----------+--------------+ FV Prox  Full                                                        +---------+---------------+---------+-----------+----------+--------------+ FV Mid   Full                                                        +---------+---------------+---------+-----------+----------+--------------+ FV DistalFull                                                        +---------+---------------+---------+-----------+----------+--------------+ PFV      Full                                                        +---------+---------------+---------+-----------+----------+--------------+ POP      Full           Yes      Yes                                 +---------+---------------+---------+-----------+----------+--------------+ PTV      Full                                                        +---------+---------------+---------+-----------+----------+--------------+ PERO     Full                                                        +---------+---------------+---------+-----------+----------+--------------+ Gastroc  Full                                                        +---------+---------------+---------+-----------+----------+--------------+   +---------+---------------+---------+-----------+----------+--------------+ LEFT     CompressibilityPhasicitySpontaneityPropertiesThrombus Aging +---------+---------------+---------+-----------+----------+--------------+ CFV      Full           Yes      Yes                                  +---------+---------------+---------+-----------+----------+--------------+  SFJ      Full                                                        +---------+---------------+---------+-----------+----------+--------------+ FV Prox  Full                                                        +---------+---------------+---------+-----------+----------+--------------+ FV Mid   Full                                                        +---------+---------------+---------+-----------+----------+--------------+ FV DistalFull                                                        +---------+---------------+---------+-----------+----------+--------------+ PFV      Full                                                        +---------+---------------+---------+-----------+----------+--------------+ POP      Full           Yes      Yes                                 +---------+---------------+---------+-----------+----------+--------------+ PTV      Full                                                        +---------+---------------+---------+-----------+----------+--------------+ PERO     Full                                                        +---------+---------------+---------+-----------+----------+--------------+     Summary: BILATERAL: - No evidence of deep vein thrombosis seen in the lower extremities, bilaterally. - RIGHT: - No cystic structure found in the popliteal fossa.  LEFT: - No cystic structure found in the popliteal fossa.  *See table(s) above for measurements and observations.    Preliminary     Procedures Procedures (including critical care time)  Medications Ordered in ED Medications - No data to display  ED Course  I have reviewed the triage vital signs and the nursing notes.  Pertinent labs & imaging results that were available during my care of the patient were reviewed by me and considered in my medical  decision making (see  chart for details).  49 year old female with posterior right leg pain and tightness for the past week or so.  Her vital signs are normal.  She is well-appearing on exam.  DVT study was obtained in triage and is negative for DVT bilaterally. Her exam is unremarkable. There are no signs of swelling or infection. She has dopplerable pulses.  Question radiculopathy versus muscle pain from prolonged standing and recent increase in activity.  Offered blood work including CK to rule out rhabdomyolysis patient is declining at this time.  She is advised to hydrate and to follow-up with her PCP if she has ongoing symptoms.  She was encouraged to return to the ER for worsening symptoms  MDM Rules/Calculators/A&P                           Final Clinical Impression(s) / ED Diagnoses Final diagnoses:  Right leg pain    Rx / DC Orders ED Discharge Orders    None       Bethel Born, PA-C 02/20/20 2133    Derwood Kaplan, MD 02/22/20 1350

## 2020-02-20 NOTE — ED Triage Notes (Signed)
Pt reports "tightness" to R calf and posterior R knee x 1 week. She states she is concerned because her brother died of a blood clot and her nephew has a DVT.

## 2020-02-20 NOTE — Discharge Instructions (Signed)
Your DVT study was negative today Please drink plenty of fluids and make sure your are getting adequate potassium Consider following up with your doctor if symptoms are not improving to see if you need possible CK levels drawn to rule out rhabdomyolysis Return to the ER for worsening symptoms

## 2020-02-20 NOTE — ED Notes (Signed)
Candy in vascular US called and states pt is now reporting bilateral leg pain and requesting dopplers of bilateral lower legs.

## 2020-04-03 ENCOUNTER — Other Ambulatory Visit: Payer: Self-pay | Admitting: Internal Medicine

## 2020-04-03 ENCOUNTER — Ambulatory Visit
Admission: RE | Admit: 2020-04-03 | Discharge: 2020-04-03 | Disposition: A | Discharge status: Home / Self Care | Payer: BC Managed Care – PPO | Source: Ambulatory Visit | Attending: Internal Medicine | Admitting: Internal Medicine

## 2020-04-03 DIAGNOSIS — M545 Low back pain, unspecified: Secondary | ICD-10-CM

## 2020-04-03 DIAGNOSIS — M5410 Radiculopathy, site unspecified: Secondary | ICD-10-CM

## 2021-07-27 ENCOUNTER — Other Ambulatory Visit: Payer: Self-pay | Admitting: Family Medicine

## 2021-07-27 DIAGNOSIS — G8929 Other chronic pain: Secondary | ICD-10-CM

## 2021-08-09 ENCOUNTER — Ambulatory Visit
Admission: RE | Admit: 2021-08-09 | Discharge: 2021-08-09 | Disposition: A | Discharge status: Home / Self Care | Payer: BC Managed Care – PPO | Source: Ambulatory Visit | Attending: Family Medicine | Admitting: Family Medicine

## 2021-08-09 DIAGNOSIS — R109 Unspecified abdominal pain: Secondary | ICD-10-CM

## 2021-08-17 ENCOUNTER — Other Ambulatory Visit: Payer: BC Managed Care – PPO

## 2023-07-11 IMAGING — US US ABDOMEN COMPLETE
1 series · 14 of 25 positions shown · non-contrast
Comparison: Ultrasound 02/05/2017

CLINICAL DATA: Chronic abdominal pain

EXAM:
ABDOMEN ULTRASOUND COMPLETE

[Series 1: us abdomen complete · 0.22mm/px · 14 of 76 slices shown]
[im 1/76]
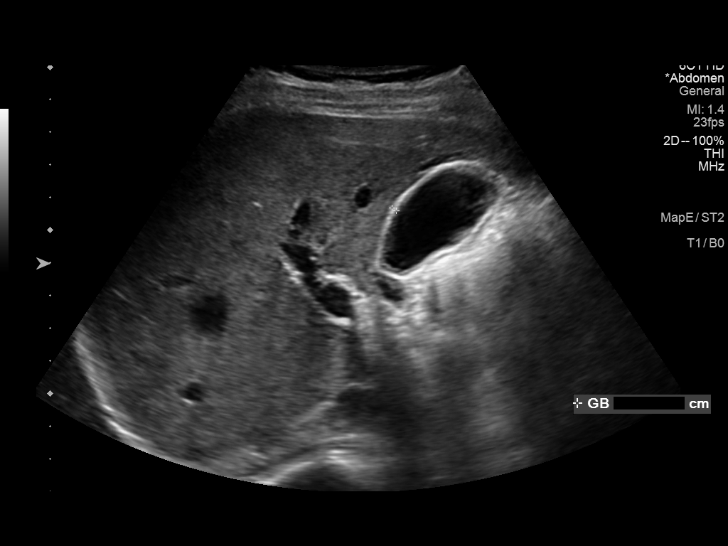
[im 7/76]
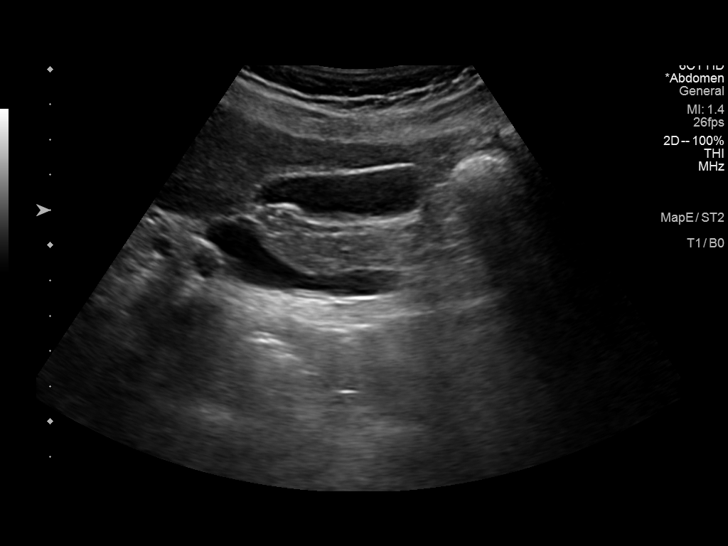
[im 13/76]
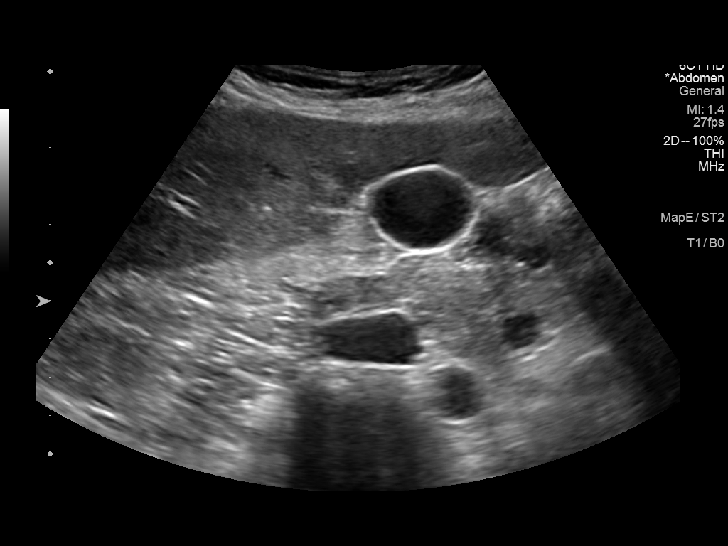
[im 19/76]
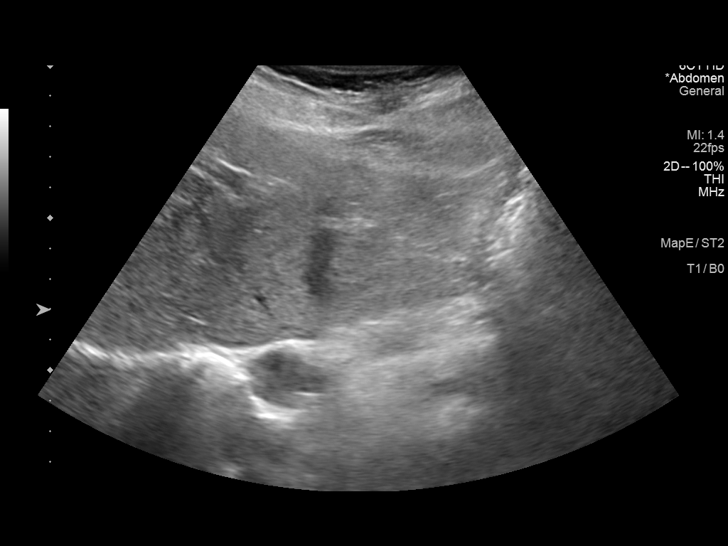
[im 26/76]
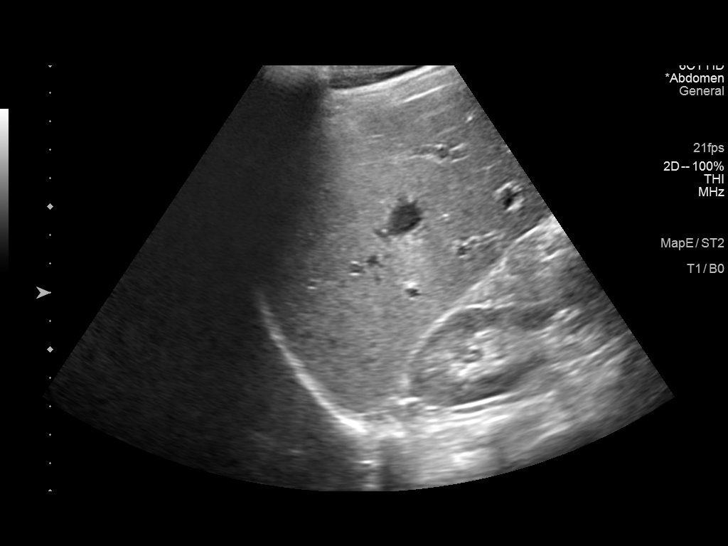
[im 29/76]
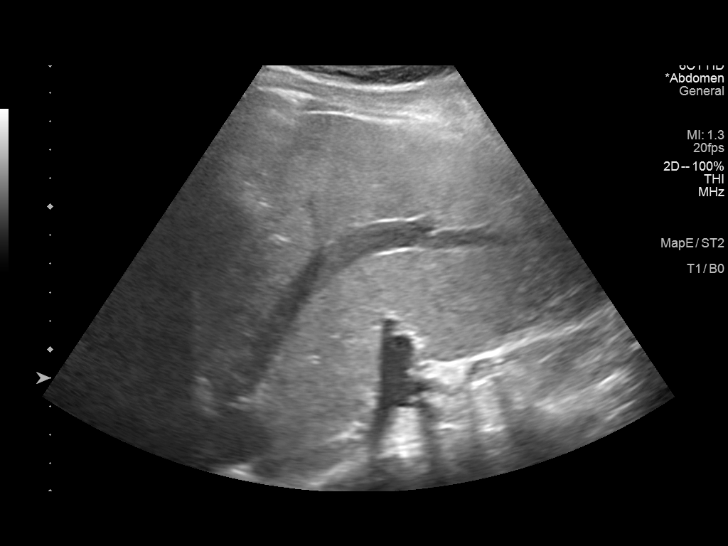
[im 35/76]
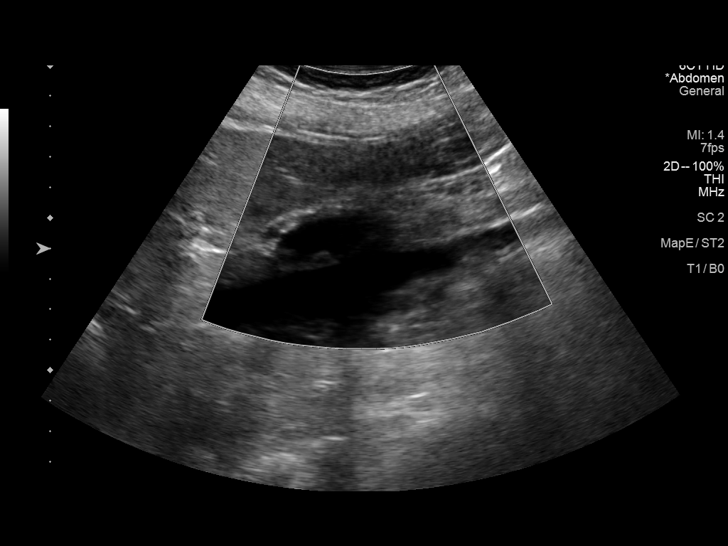
[im 41/76]
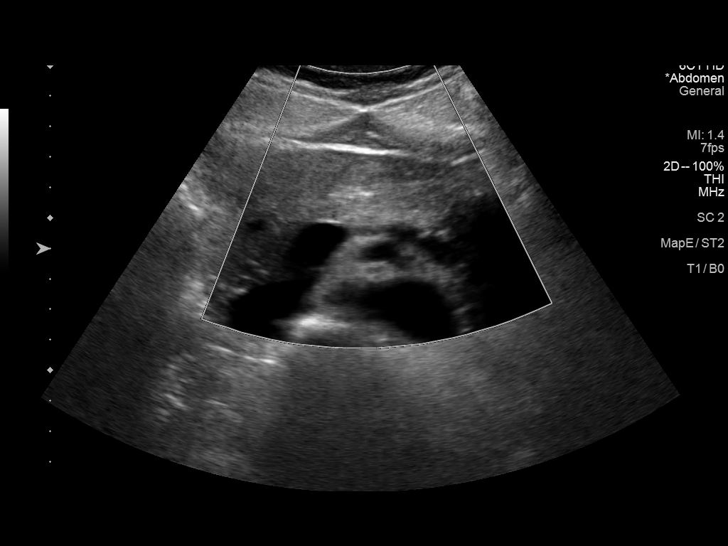
[im 47/76]
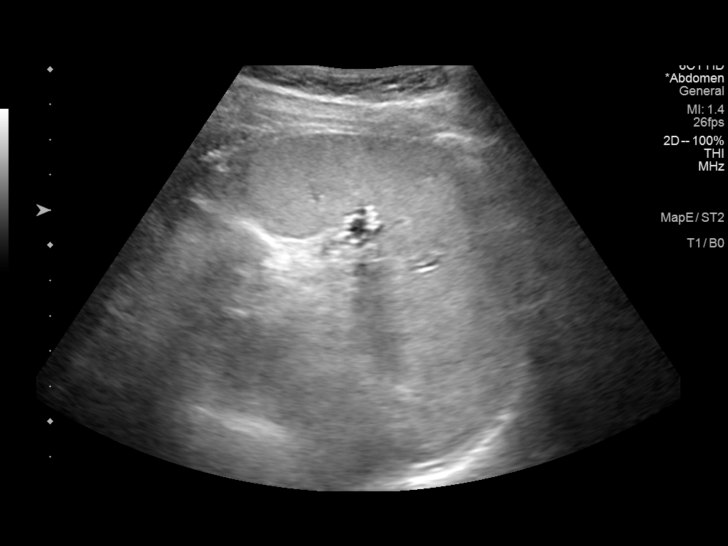
[im 51/76]
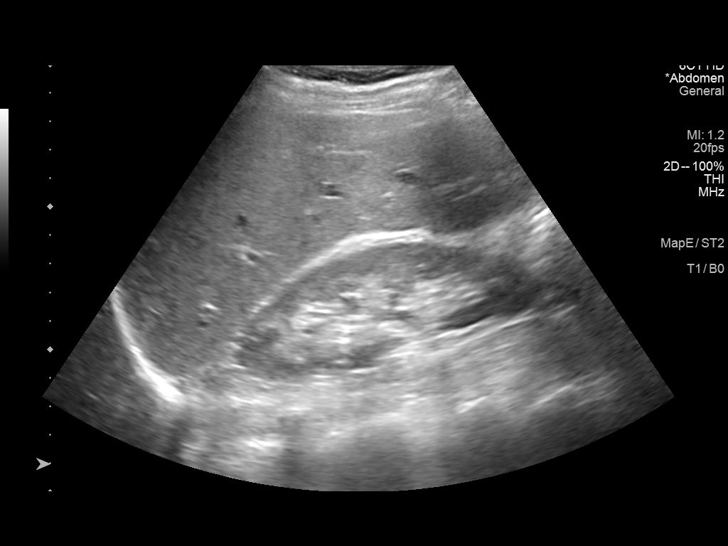
[im 57/76]
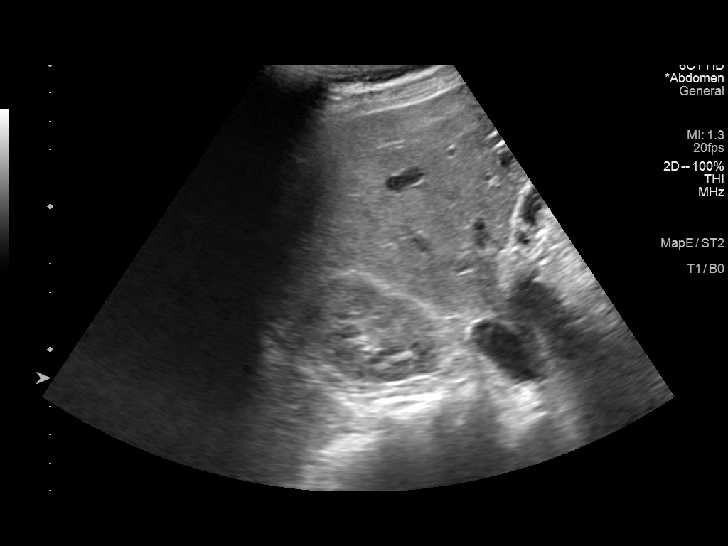
[im 63/76]
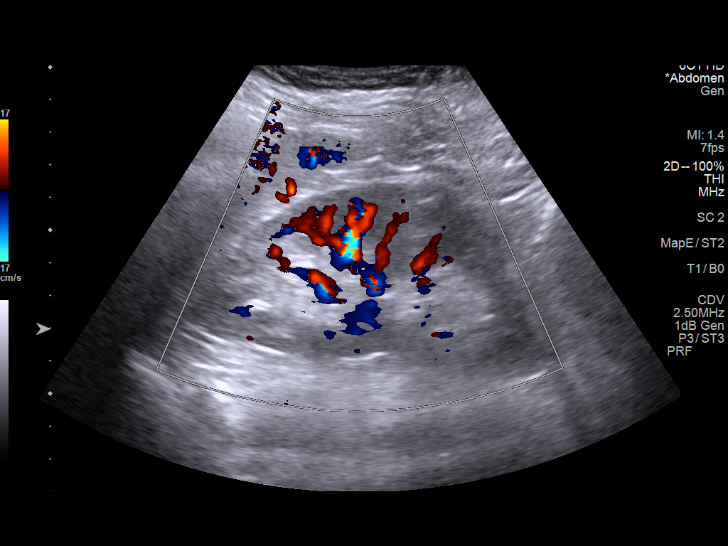
[im 69/76]
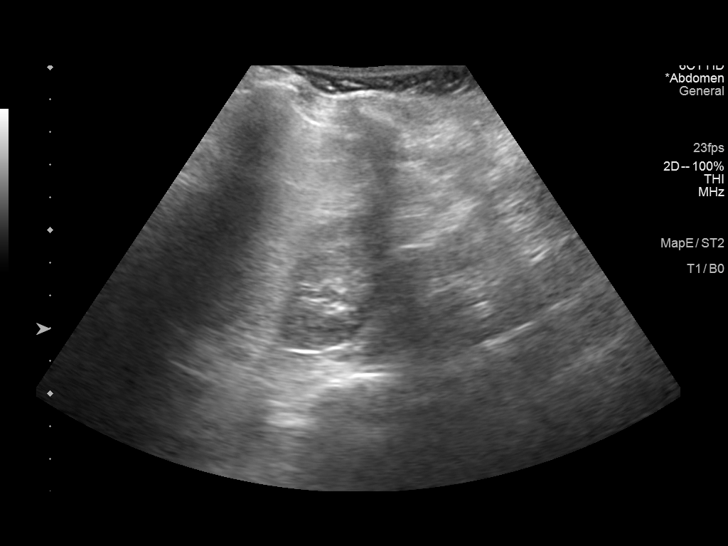
[im 76/76]
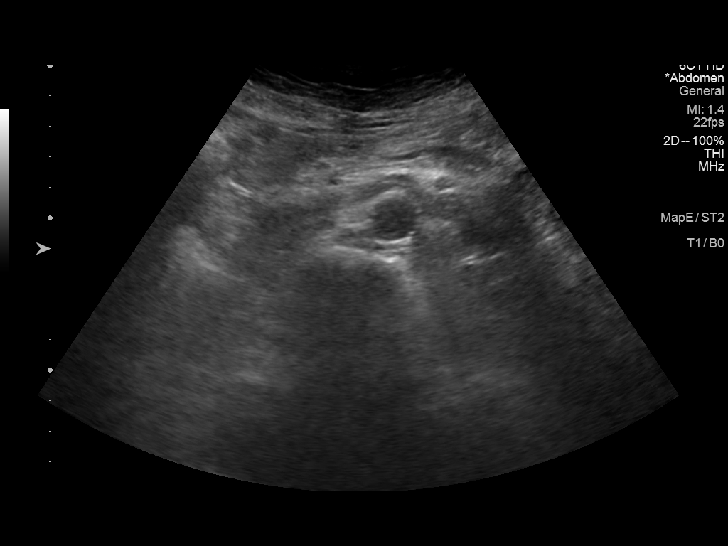

[14 of 25 positions shown; findings below may reference images not displayed]

FINDINGS: Gallbladder: No gallstones or wall thickening visualized. No
sonographic Murphy sign noted by sonographer.

Common bile duct: Diameter: 2 mm

Liver: No focal lesion identified. Within normal limits in
parenchymal echogenicity. Portal vein is patent on color Doppler
imaging with normal direction of blood flow towards the liver.

IVC: No abnormality visualized.

Pancreas: Visualized portion unremarkable.

Spleen: Size and appearance within normal limits.

Right Kidney: Length: 10.9 cm. Echogenicity within normal limits. No
mass or hydronephrosis visualized.

Left Kidney: Length: 11.7 cm. Echogenicity within normal limits. No
mass or hydronephrosis visualized.

Abdominal aorta: No aneurysm visualized.

Other findings: None.
IMPRESSION: Negative examination.

## 2024-08-26 ENCOUNTER — Other Ambulatory Visit: Payer: Self-pay | Admitting: Internal Medicine

## 2024-08-26 DIAGNOSIS — R109 Unspecified abdominal pain: Secondary | ICD-10-CM

## 2024-08-27 ENCOUNTER — Ambulatory Visit
Admission: RE | Admit: 2024-08-27 | Discharge: 2024-08-27 | Disposition: A | Discharge status: Home / Self Care | Source: Ambulatory Visit | Attending: Internal Medicine | Admitting: Internal Medicine

## 2024-08-27 DIAGNOSIS — R109 Unspecified abdominal pain: Secondary | ICD-10-CM

## 2024-08-27 MED ORDER — IOPAMIDOL (ISOVUE-300) INJECTION 61%
100.0000 mL | Freq: Once | INTRAVENOUS | Status: AC | PRN
  Administered 2024-08-27: 100 mL via INTRAVENOUS
